# Patient Record
Sex: Female | Born: 1935 | Race: White | Hispanic: No | Marital: Married | Smoking: Never smoker
Health system: Southern US, Community
[De-identification: ages and names within clinical notes are randomized; demographics above are authoritative.]

## PROBLEM LIST (undated history)

## (undated) DIAGNOSIS — H9190 Unspecified hearing loss, unspecified ear: Secondary | ICD-10-CM

## (undated) DIAGNOSIS — T4145XA Adverse effect of unspecified anesthetic, initial encounter: Secondary | ICD-10-CM

## (undated) DIAGNOSIS — R609 Edema, unspecified: Secondary | ICD-10-CM

## (undated) DIAGNOSIS — M1990 Unspecified osteoarthritis, unspecified site: Secondary | ICD-10-CM

## (undated) DIAGNOSIS — T8859XA Other complications of anesthesia, initial encounter: Secondary | ICD-10-CM

## (undated) DIAGNOSIS — K449 Diaphragmatic hernia without obstruction or gangrene: Secondary | ICD-10-CM

## (undated) DIAGNOSIS — R600 Localized edema: Secondary | ICD-10-CM

## (undated) DIAGNOSIS — I499 Cardiac arrhythmia, unspecified: Secondary | ICD-10-CM

## (undated) DIAGNOSIS — Z8719 Personal history of other diseases of the digestive system: Secondary | ICD-10-CM

## (undated) DIAGNOSIS — K219 Gastro-esophageal reflux disease without esophagitis: Secondary | ICD-10-CM

## (undated) DIAGNOSIS — R319 Hematuria, unspecified: Secondary | ICD-10-CM

## (undated) HISTORY — DX: Localized edema: R60.0

## (undated) HISTORY — PX: RECTAL POLYPECTOMY: SHX2309

## (undated) HISTORY — DX: Personal history of other diseases of the digestive system: Z87.19

## (undated) HISTORY — PX: HAMMER TOE SURGERY: SHX385

## (undated) HISTORY — PX: TOTAL ABDOMINAL HYSTERECTOMY: SHX209

## (undated) HISTORY — DX: Edema, unspecified: R60.9

## (undated) HISTORY — PX: ROTATOR CUFF REPAIR: SHX139

## (undated) HISTORY — PX: ESOPHAGOGASTRODUODENOSCOPY ENDOSCOPY: SHX5814

## (undated) HISTORY — DX: Diaphragmatic hernia without obstruction or gangrene: K44.9

## (undated) HISTORY — DX: Gastro-esophageal reflux disease without esophagitis: K21.9

## (undated) HISTORY — PX: OTHER SURGICAL HISTORY: SHX169

## (undated) HISTORY — PX: KNEE ARTHROSCOPY: SUR90

## (undated) HISTORY — DX: Hematuria, unspecified: R31.9

## (undated) HISTORY — PX: CYSTECTOMY: SUR359

---

## 2000-07-05 ENCOUNTER — Other Ambulatory Visit: Admission: RE | Admit: 2000-07-05 | Discharge: 2000-07-05 | Payer: Self-pay | Admitting: *Deleted

## 2000-07-25 ENCOUNTER — Encounter: Admission: RE | Admit: 2000-07-25 | Discharge: 2000-07-25 | Payer: Self-pay | Admitting: Obstetrics and Gynecology

## 2000-07-25 ENCOUNTER — Encounter: Payer: Self-pay | Admitting: Obstetrics and Gynecology

## 2000-08-16 ENCOUNTER — Ambulatory Visit (HOSPITAL_COMMUNITY): Admission: RE | Admit: 2000-08-16 | Discharge: 2000-08-16 | Payer: Self-pay | Admitting: Obstetrics and Gynecology

## 2000-08-16 ENCOUNTER — Encounter: Payer: Self-pay | Admitting: Obstetrics and Gynecology

## 2000-08-24 ENCOUNTER — Encounter (INDEPENDENT_AMBULATORY_CARE_PROVIDER_SITE_OTHER): Payer: Self-pay | Admitting: Specialist

## 2000-08-24 ENCOUNTER — Inpatient Hospital Stay (HOSPITAL_COMMUNITY): Admission: RE | Admit: 2000-08-24 | Discharge: 2000-08-26 | Payer: Self-pay | Admitting: Obstetrics and Gynecology

## 2001-05-19 ENCOUNTER — Encounter: Payer: Self-pay | Admitting: Otolaryngology

## 2001-05-19 ENCOUNTER — Ambulatory Visit (HOSPITAL_COMMUNITY): Admission: RE | Admit: 2001-05-19 | Discharge: 2001-05-19 | Payer: Self-pay | Admitting: Otolaryngology

## 2001-11-13 ENCOUNTER — Encounter: Payer: Self-pay | Admitting: Obstetrics and Gynecology

## 2001-11-13 ENCOUNTER — Encounter: Admission: RE | Admit: 2001-11-13 | Discharge: 2001-11-13 | Payer: Self-pay | Admitting: Obstetrics and Gynecology

## 2003-05-07 ENCOUNTER — Encounter: Payer: Self-pay | Admitting: Obstetrics and Gynecology

## 2003-05-07 ENCOUNTER — Encounter: Admission: RE | Admit: 2003-05-07 | Discharge: 2003-05-07 | Payer: Self-pay | Admitting: Obstetrics and Gynecology

## 2003-05-10 ENCOUNTER — Encounter: Admission: RE | Admit: 2003-05-10 | Discharge: 2003-05-10 | Payer: Self-pay | Admitting: Obstetrics and Gynecology

## 2003-05-10 ENCOUNTER — Encounter: Payer: Self-pay | Admitting: Obstetrics and Gynecology

## 2004-09-02 ENCOUNTER — Ambulatory Visit (HOSPITAL_COMMUNITY): Admission: RE | Admit: 2004-09-02 | Discharge: 2004-09-02 | Payer: Self-pay | Admitting: Gastroenterology

## 2004-12-10 ENCOUNTER — Ambulatory Visit (HOSPITAL_BASED_OUTPATIENT_CLINIC_OR_DEPARTMENT_OTHER): Admission: RE | Admit: 2004-12-10 | Discharge: 2004-12-10 | Payer: Self-pay | Admitting: Orthopedic Surgery

## 2004-12-10 ENCOUNTER — Ambulatory Visit (HOSPITAL_COMMUNITY): Admission: RE | Admit: 2004-12-10 | Discharge: 2004-12-10 | Payer: Self-pay | Admitting: Orthopedic Surgery

## 2004-12-12 ENCOUNTER — Emergency Department (HOSPITAL_COMMUNITY): Admission: EM | Admit: 2004-12-12 | Discharge: 2004-12-12 | Payer: Self-pay | Admitting: Emergency Medicine

## 2005-12-06 ENCOUNTER — Encounter: Admission: RE | Admit: 2005-12-06 | Discharge: 2005-12-06 | Payer: Self-pay | Admitting: Obstetrics and Gynecology

## 2007-10-11 ENCOUNTER — Encounter: Admission: RE | Admit: 2007-10-11 | Discharge: 2007-10-11 | Payer: Self-pay | Admitting: Obstetrics and Gynecology

## 2007-11-06 ENCOUNTER — Encounter: Admission: RE | Admit: 2007-11-06 | Discharge: 2007-11-06 | Payer: Self-pay | Admitting: Obstetrics and Gynecology

## 2008-01-03 ENCOUNTER — Encounter: Admission: RE | Admit: 2008-01-03 | Discharge: 2008-01-03 | Payer: Self-pay | Admitting: Orthopedic Surgery

## 2008-12-30 ENCOUNTER — Encounter: Admission: RE | Admit: 2008-12-30 | Discharge: 2008-12-30 | Payer: Self-pay | Admitting: Family Medicine

## 2009-07-18 ENCOUNTER — Encounter: Admission: RE | Admit: 2009-07-18 | Discharge: 2009-07-18 | Payer: Self-pay | Admitting: Family Medicine

## 2010-11-15 ENCOUNTER — Encounter: Payer: Self-pay | Admitting: Family Medicine

## 2010-11-15 ENCOUNTER — Encounter: Payer: Self-pay | Admitting: Obstetrics and Gynecology

## 2011-02-15 ENCOUNTER — Other Ambulatory Visit: Payer: Self-pay | Admitting: Family Medicine

## 2011-02-15 DIAGNOSIS — Z1231 Encounter for screening mammogram for malignant neoplasm of breast: Secondary | ICD-10-CM

## 2011-02-25 ENCOUNTER — Ambulatory Visit
Admission: RE | Admit: 2011-02-25 | Discharge: 2011-02-25 | Disposition: A | Discharge status: Home / Self Care | Payer: Medicare Other | Source: Ambulatory Visit | Attending: Family Medicine | Admitting: Family Medicine

## 2011-02-25 DIAGNOSIS — Z1231 Encounter for screening mammogram for malignant neoplasm of breast: Secondary | ICD-10-CM

## 2011-02-26 ENCOUNTER — Other Ambulatory Visit: Payer: Self-pay | Admitting: Family Medicine

## 2011-02-26 DIAGNOSIS — R928 Other abnormal and inconclusive findings on diagnostic imaging of breast: Secondary | ICD-10-CM

## 2011-03-03 ENCOUNTER — Ambulatory Visit
Admission: RE | Admit: 2011-03-03 | Discharge: 2011-03-03 | Disposition: A | Discharge status: Home / Self Care | Payer: Medicare Other | Source: Ambulatory Visit | Attending: Family Medicine | Admitting: Family Medicine

## 2011-03-03 DIAGNOSIS — R928 Other abnormal and inconclusive findings on diagnostic imaging of breast: Secondary | ICD-10-CM

## 2011-03-12 NOTE — Discharge Summary (Signed)
[facility]  Patient:    Brenda Hunter, Brenda Hunter                   MRN: [id number] Adm. Date:  [id number] Disc. Date: [id number] Attending:  Lendon Colonel                           Discharge Summary  ADMITTING DIAGNOSES:  Genital prolapse with pelvic pressure and discomfort.  DISCHARGE DIAGNOSES:  Genital prolapse with pelvic pressure and discomfort.  OPERATION PERFORMED:  Laparoscopically assisted vaginal hysterectomy, bilateral salpingo-oophorectomy, anterior and posterior repair.  BRIEF HISTORY:  This patient is a 76 year old female who is using ______ suppositories for the improvement of vaginal health who presents for vaginal hysterectomy for complete prolapse. She has complained of pelvic pressure and things that are falling out when she stands. In fact, she had complete prolapse. She had been evaluated by Dr. Logan Bores most recently for persistent hematuria and found to be in good health. She denied stress incontinence.  LABORATORY DATA:  Admission hemoglobin was 14, white count 7,000, coagulation profile was normal, cardiogram was normal.  HOSPITAL COURSE:  The patient was admitted to the hospital and underwent an uneventful laparoscopically assisted vaginal hysterectomy, bilateral salpingo-oophorectomy, posterior repair. Her postoperative course was uncomplicated. She was discharged on November 2 to continue Cipro 1 daily, Pyridium, Surfak and Percocet for discomfort.  CONDITION ON DISCHARGE:  Improved. DD:  09/14/00 TD:  09/16/00 Job: [id number]

## 2011-03-12 NOTE — Op Note (Signed)
[facility]  Patient:    Brenda Hunter, Brenda Hunter                   MRN: [id number] Proc. Date: 08/24/00 Adm. Date:  [id number] Attending:  Lendon Colonel                           Operative Report  PREOPERATIVE DIAGNOSIS:  Complete genital prolapse with cystocele, rectocele, and uterine descensus.  POSTOPERATIVE DIAGNOSIS:  Complete genital prolapse with cystocele, rectocele, and uterine descensus.  PROCEDURE:  Laparoscopically assisted vaginal hysterectomy, bilateral salpingo-oophorectomy, anterior and posterior colporrhaphy.  DESCRIPTION OF PROCEDURE:  The patient was placed in the lithotomy position and prepped and draped in the usual fashion.  There was a marked amount of descensus.  The Hulka elevator was inserted into the cervix.  A transverse incision was made in the umbilicus.  The abdomen was distended with carbon dioxide.  This was under low pressure and distended to about 2.5 L.  Trocar was then inserted into the abdomen, and visualization of the pelvis revealed a very small right ovary.  The left ovary was stuck under the colon from adhesions.  Three other trocar sites were utilized, 5 mm in both lower quadrants and in the midline.  The grasper forceps were used to tent up the right infundibulopelvic ligament and the right round ligament, and these were severed with the Seitzinger tripolar forceps.  Attention was then drawn to the patients left side, and the peritoneum was entered anteriorly to dissect the infundibulopelvic ligament from the colon.  The congenital adhesions were removed from the colon, and the colon was pushed downward to reveal thickened infundibulopelvic ligament, which was carefully skeletonized, and then the infundibulopelvic ligament was severed using the Seitzinger tripolar forceps. Round ligament on that side was severed, and the uterine vessels were severed using the Seitzinger 5 mm tripolar forceps.  In a similar  fashion was handled on the right side, bladder flap was created, and we went down below to complete the vaginal hysterectomy by incising and anteriorly and posteriorly clamping the uterosacral ligaments and the cardinal ligaments, and then the uterus was removed from the operative field.  Following this, the uterosacral ligaments were plicated in the midline, first with 0 Vicryl and then with an 0 Ethibond permanent suture in front of behind that.  This afforded good vault support.  The posterior vagina was then closed with interrupted sutures of 0 Vicryl and then the anterior peritoneum was closed with 2-0 PDS.  Anterior repair was then effected by incising in the vagina and plicating the pubocervical vaginal fascia with interrupted sutures of 3-0 Vicryl.  A wedge of the vagina anteriorly was removed and then closed with the continuation of the peritoneal suture.  This obliterated the dead space.  We then went down below and did the posterior repair by wedging out the perineal body and in dissecting the vagina from the underlying rectum, the lateral prerectal fascia was then plicated in the midline with interrupted sutures of 3-0 Vicryl.  This effected a good posterior repair.  The vagina was then closed with a running suture of 2-0 chromic, perineal skin was closed with this continuous suture. Hemostasis was secure.  The vagina was packed.  We then went above and inspected the trocar sites.  These were hemostatically secure.  The pedicles were secure.  Copious amounts of irrigation were used to irrigate the pedicles.  Then we closed the  umbilical incision, which was a 10 mm trocar, with one suture of 0 Vicryl, followed by the skin incisions of all four ports were closed with a subcuticular 3-0 Vicryl.  The incisions were then infiltrated with 0.5% Marcaine with epinephrine.  Brenda Hunter tolerated this procedure well and was sent to the recovery room in good condition. DD:  08/24/00 TD:   08/24/00 Job: [id number]

## 2011-03-12 NOTE — Op Note (Signed)
Brenda Hunter, Brenda Hunter                ACCOUNT NO.:  [id number]   MEDICAL RECORD NO.:  [id number]          PATIENT TYPE:  AMB   LOCATION:  DSC                          FACILITY:  [facility]   PHYSICIAN:  Rodney A. Mortenson, M.D.DATE OF BIRTH:  [birth year]   DATE OF PROCEDURE:  12/10/2004  DATE OF DISCHARGE:                                 OPERATIVE REPORT   PREOPERATIVE DIAGNOSIS:  Tear of rotator cuff of right shoulder.   POSTOPERATIVE DIAGNOSES:  1.  Full-thickness tear of rotator cuff of right shoulder.  2.  Tear of anterosuperior labrum of right shoulder.   OPERATIONS:  1.  Arthroscopy.  2.  Debridement of anterosuperior labrum through the arthroscope.  3.  Open acromioplasty and open repair of rotator cuff tear of right      shoulder.   SURGEON:  Lenard Galloway. Chaney Malling, M.D.   ANESTHESIA:  General.   DESCRIPTION OF PROCEDURE:  The patient was placed on the operating table in  the supine position.  After satisfactory general anesthesia, the patient was  placed in a semiseated position.  The right upper extremity and shoulder  were prepped with Duraprep and draped out in the usual manner.  Through the  standard posterior portal, the arthroscope was introduced and through an  anterior operative portal, a full radial resector was inserted.  Articular  cartilage of the humeral head and the glenoid appeared absolutely normal.  The inferior part of the labrum was normal, but the superior part was frayed  and torn.  The biceps anchor was intact.  The rest of the biceps tendon as  it exited the shoulder appeared fairly normal.  There was some fraying of  the rotator cuff superiorly.  The arthroscope could be passed through the  hole in the rotator cuff into the subacromial space.  The intra-articular  shaver was then used to debride the anterosuperior labrum.  After this was  done, this was palpated with a probe and was quite stable.  The arthroscope  was then removed and placed in the  subacromial space.  Visualization did  show the tear in the rotator cuff and the arthroscope was removed.   A saber cut incision was made over the anterolateral aspect of the right  shoulder.  Skin edges were retracted.  Bleeders were coagulated.  The self-  retaining retractor was put in place.  Deltoid fibers were released off of  the anterior aspect of the acromion only.  An acromioplasty was done.  This  gave excellent access to the shoulder.  The bursa was excised.  The tendon  of the rotator cuff was clearly seen.  The was sutured side to side with 0  Ethibond sutures.  There was an area of the rotator cuff that was markedly  thin and this was plicated with Ethibond sutures.  Throughout the procedure,  the shoulder was irrigated with copious amounts of antibiotic solution.  The  deltoid fibers were then reattached to the anterior aspect of the acromion  using 0 Vicryl sutures.  Then 2-0 Vicryl was used to close the subcutaneous  tissue and  stainless steel staples were used to close the wound.  Sterile  dressings were applied.  The patient returned to the recovery room in  excellent condition.  The procedure went extremely well.   FOLLOWUP CARE:  1.  To my office on Wednesday.  2.  Sling, right arm.  3.  Percocet for pain.      RAM/MEDQ  D:  12/10/2004  T:  12/10/2004  Job:  [id number]

## 2011-03-12 NOTE — Op Note (Signed)
Brenda Hunter, Brenda Hunter                ACCOUNT NO.:  [id number]   MEDICAL RECORD NO.:  [id number]          PATIENT TYPE:  AMB   LOCATION:  ENDO                         FACILITY:  [facility]   PHYSICIAN:  James L. Malon Kindle., M.D.DATE OF BIRTH:  [birth year]   DATE OF PROCEDURE:  09/02/2004  DATE OF DISCHARGE:                                 OPERATIVE REPORT   PROCEDURE:  Colonoscopy.   MEDICATIONS:  Fentanyl 125 mcg, Versed 10 mg IV   SCOPE:  Olympus pediatric adjustable colonoscopy.   INDICATIONS:  History of colon polyps for 5 year followup.   DESCRIPTION OF PROCEDURE:  Procedure had been explained to the patient and  consent obtained. With the patient in left lateral decubitus position,  Olympus scope was inserted and advanced. The patient did have some  diverticular disease. We were able to pass the sigmoid colon. Had a long  tortuous colon. Using abdominal pressure and position changes, were able to  advance over to the cecum. The ileocecal valve and appendiceal orifice were  seen. The scope was withdrawn to the cecum. Ascending colon, transverse  colon, descending, and sigmoid colon were seen well. There were no polyps  seen. Scope was withdrawn into the rectum, and the rectum was free of  polyps. The scope was withdrawn, and the patient tolerated the procedure  well.   ASSESSMENT:  History of colon polyps with negative colonoscopy, V12.72.   PLAN:  Will recommend yearly hemoccults and repeat colonoscopy in 5 years.      JLE/MEDQ  D:  09/02/2004  T:  09/02/2004  Job:  [id number]

## 2011-03-12 NOTE — H&P (Signed)
[facility]  Patient:    Brenda Hunter, Brenda Hunter                         MRN: [id number] Adm. Date:  08/24/00 Attending:  Katherine Roan, M.D.                         History and Physical  CHIEF COMPLAINT:  This is a 76 year old, gravida 2, para 2, postmenopausal female who is using Vagifem suppositories vaginally for improvement of vaginal health, who presents for a vaginal hysterectomy, A&P repair for a complete prolapse.  She complains of pelvic pressure and the uterus falling outside when she stands.  She had had some hematuria and evaluated three years ago by Dr. Logan Bores, and most recently was evaluated and found to be completely normal. Because of the pelvic relaxation, hysterectomy was recommended.  She has no incontinence at this time.  PAST SURGICAL HISTORY:  Breast biopsies in 1974.  Considers herself to be in excellent health.  ALLERGIES:  She has no known allergies.  MEDICATIONS:  Currently takes no medications.  REVIEW OF SYSTEMS:  HEENT:  She wears glasses but no headaches or decrease in visual or auditory acuity, or dizziness.  HEART:  No hypertension.  No rheumatic fever.  No mitral valve prolapse.  No chest pain or shortness of breath.  LUNGS:  Negative.  No chronic cough.  No asthma.  No history of hemoptysis.  GENITOURINARY:  She has occasional stress urinary tract infection but denies any incontinence.  She has minimum bladder irritability. GASTROINTESTINAL:  No bowel habit change.  No weight loss.  No anorexia.  No melena.  Muscle, bones and joints:  No fractures or arthritis.  No decrease in range of motion.  SOCIAL HISTORY:  She is a Futures trader.  FAMILY HISTORY:  Her mother is 13, living and well.  Her father is 28, living and well.  She has two brothers and five sisters who are in good health. Her father has had a pacemaker.  PHYSICAL EXAMINATION:  VITAL SIGNS:  Examination reveals a weight of 143, a blood pressure of  130/80.  HEENT:  Examination of the eyes, ears, nose, and throat are unremarkable.  Lysbeth Penner is not injected.  NECK:  Supple.  Thyroid is not enlarged.  Trachea is in the midline.  Carotid pulses are without bruits and are equal.  No adenopathy appreciated.  BREASTS:  No masses or tenderness.  Axilla free from adenopathy.  LUNGS:  Clear.  HEART:  Normal sinus rhythm.  No murmurs.  ABDOMEN:  Soft, flat.  Liver, spleen and kidneys are not palpated.  Bowel sounds are normal.  No bruits are heard.  No hernia.  EXTREMITIES:  Show good range of motion.  Equal pulses and reflexes.  PELVIC:  Examination reveals a complete prolapse of the cervix.  There is minimal cystocele, although the base of the bladder appears to come down with the uterus.  There is a moderate rectocele present.  No pelvic masses are noted.  Hemoccult is negative.  Colonoscopy about three years ago was said to be negative.  IMPRESSION:  Complete prolapse.  PLAN:  Laparoscopic-assisted hysterectomy, remove both ovaries and a posterior repair and possibly anterior repair.  Detailed informed consent has been given to the patient including vascular injury, bladder or bowel injury, infection and hemorrhage. DD:  08/24/00 TD:  08/24/00 Job: [id number]

## 2011-10-27 DIAGNOSIS — IMO0002 Reserved for concepts with insufficient information to code with codable children: Secondary | ICD-10-CM | POA: Diagnosis not present

## 2011-10-27 DIAGNOSIS — M461 Sacroiliitis, not elsewhere classified: Secondary | ICD-10-CM | POA: Diagnosis not present

## 2011-10-27 DIAGNOSIS — M999 Biomechanical lesion, unspecified: Secondary | ICD-10-CM | POA: Diagnosis not present

## 2011-10-28 DIAGNOSIS — M461 Sacroiliitis, not elsewhere classified: Secondary | ICD-10-CM | POA: Diagnosis not present

## 2011-10-28 DIAGNOSIS — IMO0002 Reserved for concepts with insufficient information to code with codable children: Secondary | ICD-10-CM | POA: Diagnosis not present

## 2011-10-28 DIAGNOSIS — M999 Biomechanical lesion, unspecified: Secondary | ICD-10-CM | POA: Diagnosis not present

## 2011-11-03 DIAGNOSIS — M461 Sacroiliitis, not elsewhere classified: Secondary | ICD-10-CM | POA: Diagnosis not present

## 2011-11-03 DIAGNOSIS — IMO0002 Reserved for concepts with insufficient information to code with codable children: Secondary | ICD-10-CM | POA: Diagnosis not present

## 2011-11-03 DIAGNOSIS — M999 Biomechanical lesion, unspecified: Secondary | ICD-10-CM | POA: Diagnosis not present

## 2011-11-04 DIAGNOSIS — R3129 Other microscopic hematuria: Secondary | ICD-10-CM | POA: Diagnosis not present

## 2011-11-04 DIAGNOSIS — N390 Urinary tract infection, site not specified: Secondary | ICD-10-CM | POA: Insufficient documentation

## 2011-11-04 DIAGNOSIS — N3020 Other chronic cystitis without hematuria: Secondary | ICD-10-CM | POA: Diagnosis not present

## 2011-11-04 DIAGNOSIS — N3000 Acute cystitis without hematuria: Secondary | ICD-10-CM | POA: Diagnosis not present

## 2011-11-05 DIAGNOSIS — Z23 Encounter for immunization: Secondary | ICD-10-CM | POA: Diagnosis not present

## 2011-11-08 DIAGNOSIS — IMO0002 Reserved for concepts with insufficient information to code with codable children: Secondary | ICD-10-CM | POA: Diagnosis not present

## 2011-11-08 DIAGNOSIS — M999 Biomechanical lesion, unspecified: Secondary | ICD-10-CM | POA: Diagnosis not present

## 2011-11-08 DIAGNOSIS — M461 Sacroiliitis, not elsewhere classified: Secondary | ICD-10-CM | POA: Diagnosis not present

## 2011-11-11 DIAGNOSIS — M999 Biomechanical lesion, unspecified: Secondary | ICD-10-CM | POA: Diagnosis not present

## 2011-11-11 DIAGNOSIS — IMO0002 Reserved for concepts with insufficient information to code with codable children: Secondary | ICD-10-CM | POA: Diagnosis not present

## 2011-11-11 DIAGNOSIS — M461 Sacroiliitis, not elsewhere classified: Secondary | ICD-10-CM | POA: Diagnosis not present

## 2011-11-15 DIAGNOSIS — M999 Biomechanical lesion, unspecified: Secondary | ICD-10-CM | POA: Diagnosis not present

## 2011-11-15 DIAGNOSIS — IMO0002 Reserved for concepts with insufficient information to code with codable children: Secondary | ICD-10-CM | POA: Diagnosis not present

## 2011-11-15 DIAGNOSIS — M461 Sacroiliitis, not elsewhere classified: Secondary | ICD-10-CM | POA: Diagnosis not present

## 2011-11-18 DIAGNOSIS — IMO0002 Reserved for concepts with insufficient information to code with codable children: Secondary | ICD-10-CM | POA: Diagnosis not present

## 2011-11-18 DIAGNOSIS — M999 Biomechanical lesion, unspecified: Secondary | ICD-10-CM | POA: Diagnosis not present

## 2011-11-18 DIAGNOSIS — M461 Sacroiliitis, not elsewhere classified: Secondary | ICD-10-CM | POA: Diagnosis not present

## 2011-11-19 DIAGNOSIS — J019 Acute sinusitis, unspecified: Secondary | ICD-10-CM | POA: Diagnosis not present

## 2011-11-22 DIAGNOSIS — IMO0002 Reserved for concepts with insufficient information to code with codable children: Secondary | ICD-10-CM | POA: Diagnosis not present

## 2011-11-22 DIAGNOSIS — M999 Biomechanical lesion, unspecified: Secondary | ICD-10-CM | POA: Diagnosis not present

## 2011-11-22 DIAGNOSIS — M461 Sacroiliitis, not elsewhere classified: Secondary | ICD-10-CM | POA: Diagnosis not present

## 2011-11-29 DIAGNOSIS — H43399 Other vitreous opacities, unspecified eye: Secondary | ICD-10-CM | POA: Diagnosis not present

## 2011-11-29 DIAGNOSIS — H40019 Open angle with borderline findings, low risk, unspecified eye: Secondary | ICD-10-CM | POA: Diagnosis not present

## 2011-11-29 DIAGNOSIS — H2510 Age-related nuclear cataract, unspecified eye: Secondary | ICD-10-CM | POA: Diagnosis not present

## 2011-11-29 DIAGNOSIS — H04129 Dry eye syndrome of unspecified lacrimal gland: Secondary | ICD-10-CM | POA: Diagnosis not present

## 2011-11-30 DIAGNOSIS — M461 Sacroiliitis, not elsewhere classified: Secondary | ICD-10-CM | POA: Diagnosis not present

## 2011-11-30 DIAGNOSIS — IMO0002 Reserved for concepts with insufficient information to code with codable children: Secondary | ICD-10-CM | POA: Diagnosis not present

## 2011-11-30 DIAGNOSIS — Q825 Congenital non-neoplastic nevus: Secondary | ICD-10-CM | POA: Diagnosis not present

## 2011-11-30 DIAGNOSIS — M999 Biomechanical lesion, unspecified: Secondary | ICD-10-CM | POA: Diagnosis not present

## 2011-12-02 DIAGNOSIS — IMO0002 Reserved for concepts with insufficient information to code with codable children: Secondary | ICD-10-CM | POA: Diagnosis not present

## 2011-12-02 DIAGNOSIS — M999 Biomechanical lesion, unspecified: Secondary | ICD-10-CM | POA: Diagnosis not present

## 2011-12-02 DIAGNOSIS — M461 Sacroiliitis, not elsewhere classified: Secondary | ICD-10-CM | POA: Diagnosis not present

## 2011-12-06 DIAGNOSIS — M999 Biomechanical lesion, unspecified: Secondary | ICD-10-CM | POA: Diagnosis not present

## 2011-12-06 DIAGNOSIS — IMO0002 Reserved for concepts with insufficient information to code with codable children: Secondary | ICD-10-CM | POA: Diagnosis not present

## 2011-12-06 DIAGNOSIS — M461 Sacroiliitis, not elsewhere classified: Secondary | ICD-10-CM | POA: Diagnosis not present

## 2011-12-09 DIAGNOSIS — M999 Biomechanical lesion, unspecified: Secondary | ICD-10-CM | POA: Diagnosis not present

## 2011-12-09 DIAGNOSIS — M461 Sacroiliitis, not elsewhere classified: Secondary | ICD-10-CM | POA: Diagnosis not present

## 2011-12-09 DIAGNOSIS — IMO0002 Reserved for concepts with insufficient information to code with codable children: Secondary | ICD-10-CM | POA: Diagnosis not present

## 2011-12-23 DIAGNOSIS — M461 Sacroiliitis, not elsewhere classified: Secondary | ICD-10-CM | POA: Diagnosis not present

## 2011-12-23 DIAGNOSIS — IMO0002 Reserved for concepts with insufficient information to code with codable children: Secondary | ICD-10-CM | POA: Diagnosis not present

## 2011-12-23 DIAGNOSIS — M999 Biomechanical lesion, unspecified: Secondary | ICD-10-CM | POA: Diagnosis not present

## 2011-12-29 DIAGNOSIS — IMO0002 Reserved for concepts with insufficient information to code with codable children: Secondary | ICD-10-CM | POA: Diagnosis not present

## 2011-12-29 DIAGNOSIS — M999 Biomechanical lesion, unspecified: Secondary | ICD-10-CM | POA: Diagnosis not present

## 2011-12-29 DIAGNOSIS — M461 Sacroiliitis, not elsewhere classified: Secondary | ICD-10-CM | POA: Diagnosis not present

## 2012-01-03 DIAGNOSIS — IMO0002 Reserved for concepts with insufficient information to code with codable children: Secondary | ICD-10-CM | POA: Diagnosis not present

## 2012-01-03 DIAGNOSIS — M461 Sacroiliitis, not elsewhere classified: Secondary | ICD-10-CM | POA: Diagnosis not present

## 2012-01-03 DIAGNOSIS — M999 Biomechanical lesion, unspecified: Secondary | ICD-10-CM | POA: Diagnosis not present

## 2012-01-11 DIAGNOSIS — M461 Sacroiliitis, not elsewhere classified: Secondary | ICD-10-CM | POA: Diagnosis not present

## 2012-01-11 DIAGNOSIS — M999 Biomechanical lesion, unspecified: Secondary | ICD-10-CM | POA: Diagnosis not present

## 2012-01-11 DIAGNOSIS — IMO0002 Reserved for concepts with insufficient information to code with codable children: Secondary | ICD-10-CM | POA: Diagnosis not present

## 2012-01-13 DIAGNOSIS — M461 Sacroiliitis, not elsewhere classified: Secondary | ICD-10-CM | POA: Diagnosis not present

## 2012-01-13 DIAGNOSIS — M999 Biomechanical lesion, unspecified: Secondary | ICD-10-CM | POA: Diagnosis not present

## 2012-01-13 DIAGNOSIS — IMO0002 Reserved for concepts with insufficient information to code with codable children: Secondary | ICD-10-CM | POA: Diagnosis not present

## 2012-01-18 DIAGNOSIS — IMO0002 Reserved for concepts with insufficient information to code with codable children: Secondary | ICD-10-CM | POA: Diagnosis not present

## 2012-01-18 DIAGNOSIS — M999 Biomechanical lesion, unspecified: Secondary | ICD-10-CM | POA: Diagnosis not present

## 2012-01-18 DIAGNOSIS — M461 Sacroiliitis, not elsewhere classified: Secondary | ICD-10-CM | POA: Diagnosis not present

## 2012-01-25 DIAGNOSIS — IMO0002 Reserved for concepts with insufficient information to code with codable children: Secondary | ICD-10-CM | POA: Diagnosis not present

## 2012-01-25 DIAGNOSIS — M999 Biomechanical lesion, unspecified: Secondary | ICD-10-CM | POA: Diagnosis not present

## 2012-01-25 DIAGNOSIS — M461 Sacroiliitis, not elsewhere classified: Secondary | ICD-10-CM | POA: Diagnosis not present

## 2012-02-03 DIAGNOSIS — IMO0002 Reserved for concepts with insufficient information to code with codable children: Secondary | ICD-10-CM | POA: Diagnosis not present

## 2012-02-03 DIAGNOSIS — M999 Biomechanical lesion, unspecified: Secondary | ICD-10-CM | POA: Diagnosis not present

## 2012-02-03 DIAGNOSIS — M461 Sacroiliitis, not elsewhere classified: Secondary | ICD-10-CM | POA: Diagnosis not present

## 2012-02-10 DIAGNOSIS — M461 Sacroiliitis, not elsewhere classified: Secondary | ICD-10-CM | POA: Diagnosis not present

## 2012-02-10 DIAGNOSIS — IMO0002 Reserved for concepts with insufficient information to code with codable children: Secondary | ICD-10-CM | POA: Diagnosis not present

## 2012-02-10 DIAGNOSIS — M999 Biomechanical lesion, unspecified: Secondary | ICD-10-CM | POA: Diagnosis not present

## 2012-02-15 DIAGNOSIS — M999 Biomechanical lesion, unspecified: Secondary | ICD-10-CM | POA: Diagnosis not present

## 2012-02-15 DIAGNOSIS — IMO0002 Reserved for concepts with insufficient information to code with codable children: Secondary | ICD-10-CM | POA: Diagnosis not present

## 2012-02-15 DIAGNOSIS — M461 Sacroiliitis, not elsewhere classified: Secondary | ICD-10-CM | POA: Diagnosis not present

## 2012-02-22 DIAGNOSIS — M461 Sacroiliitis, not elsewhere classified: Secondary | ICD-10-CM | POA: Diagnosis not present

## 2012-02-22 DIAGNOSIS — M999 Biomechanical lesion, unspecified: Secondary | ICD-10-CM | POA: Diagnosis not present

## 2012-02-22 DIAGNOSIS — IMO0002 Reserved for concepts with insufficient information to code with codable children: Secondary | ICD-10-CM | POA: Diagnosis not present

## 2012-03-14 DIAGNOSIS — M461 Sacroiliitis, not elsewhere classified: Secondary | ICD-10-CM | POA: Diagnosis not present

## 2012-03-14 DIAGNOSIS — IMO0002 Reserved for concepts with insufficient information to code with codable children: Secondary | ICD-10-CM | POA: Diagnosis not present

## 2012-03-14 DIAGNOSIS — M999 Biomechanical lesion, unspecified: Secondary | ICD-10-CM | POA: Diagnosis not present

## 2012-03-21 DIAGNOSIS — M461 Sacroiliitis, not elsewhere classified: Secondary | ICD-10-CM | POA: Diagnosis not present

## 2012-03-21 DIAGNOSIS — IMO0002 Reserved for concepts with insufficient information to code with codable children: Secondary | ICD-10-CM | POA: Diagnosis not present

## 2012-03-21 DIAGNOSIS — M999 Biomechanical lesion, unspecified: Secondary | ICD-10-CM | POA: Diagnosis not present

## 2012-03-23 DIAGNOSIS — IMO0002 Reserved for concepts with insufficient information to code with codable children: Secondary | ICD-10-CM | POA: Diagnosis not present

## 2012-03-23 DIAGNOSIS — M999 Biomechanical lesion, unspecified: Secondary | ICD-10-CM | POA: Diagnosis not present

## 2012-03-23 DIAGNOSIS — M461 Sacroiliitis, not elsewhere classified: Secondary | ICD-10-CM | POA: Diagnosis not present

## 2012-04-04 DIAGNOSIS — M999 Biomechanical lesion, unspecified: Secondary | ICD-10-CM | POA: Diagnosis not present

## 2012-04-04 DIAGNOSIS — M461 Sacroiliitis, not elsewhere classified: Secondary | ICD-10-CM | POA: Diagnosis not present

## 2012-04-04 DIAGNOSIS — IMO0002 Reserved for concepts with insufficient information to code with codable children: Secondary | ICD-10-CM | POA: Diagnosis not present

## 2012-04-12 DIAGNOSIS — M999 Biomechanical lesion, unspecified: Secondary | ICD-10-CM | POA: Diagnosis not present

## 2012-04-12 DIAGNOSIS — IMO0002 Reserved for concepts with insufficient information to code with codable children: Secondary | ICD-10-CM | POA: Diagnosis not present

## 2012-04-12 DIAGNOSIS — M461 Sacroiliitis, not elsewhere classified: Secondary | ICD-10-CM | POA: Diagnosis not present

## 2012-04-19 DIAGNOSIS — M999 Biomechanical lesion, unspecified: Secondary | ICD-10-CM | POA: Diagnosis not present

## 2012-04-19 DIAGNOSIS — IMO0002 Reserved for concepts with insufficient information to code with codable children: Secondary | ICD-10-CM | POA: Diagnosis not present

## 2012-04-19 DIAGNOSIS — M461 Sacroiliitis, not elsewhere classified: Secondary | ICD-10-CM | POA: Diagnosis not present

## 2012-05-02 DIAGNOSIS — IMO0002 Reserved for concepts with insufficient information to code with codable children: Secondary | ICD-10-CM | POA: Diagnosis not present

## 2012-05-02 DIAGNOSIS — M461 Sacroiliitis, not elsewhere classified: Secondary | ICD-10-CM | POA: Diagnosis not present

## 2012-05-02 DIAGNOSIS — M999 Biomechanical lesion, unspecified: Secondary | ICD-10-CM | POA: Diagnosis not present

## 2012-05-08 DIAGNOSIS — IMO0002 Reserved for concepts with insufficient information to code with codable children: Secondary | ICD-10-CM | POA: Diagnosis not present

## 2012-05-08 DIAGNOSIS — M461 Sacroiliitis, not elsewhere classified: Secondary | ICD-10-CM | POA: Diagnosis not present

## 2012-05-08 DIAGNOSIS — M999 Biomechanical lesion, unspecified: Secondary | ICD-10-CM | POA: Diagnosis not present

## 2012-05-16 DIAGNOSIS — IMO0002 Reserved for concepts with insufficient information to code with codable children: Secondary | ICD-10-CM | POA: Diagnosis not present

## 2012-05-16 DIAGNOSIS — M999 Biomechanical lesion, unspecified: Secondary | ICD-10-CM | POA: Diagnosis not present

## 2012-05-16 DIAGNOSIS — M461 Sacroiliitis, not elsewhere classified: Secondary | ICD-10-CM | POA: Diagnosis not present

## 2012-05-22 DIAGNOSIS — M999 Biomechanical lesion, unspecified: Secondary | ICD-10-CM | POA: Diagnosis not present

## 2012-05-22 DIAGNOSIS — M461 Sacroiliitis, not elsewhere classified: Secondary | ICD-10-CM | POA: Diagnosis not present

## 2012-05-22 DIAGNOSIS — IMO0002 Reserved for concepts with insufficient information to code with codable children: Secondary | ICD-10-CM | POA: Diagnosis not present

## 2012-06-12 DIAGNOSIS — M999 Biomechanical lesion, unspecified: Secondary | ICD-10-CM | POA: Diagnosis not present

## 2012-06-12 DIAGNOSIS — IMO0002 Reserved for concepts with insufficient information to code with codable children: Secondary | ICD-10-CM | POA: Diagnosis not present

## 2012-06-12 DIAGNOSIS — M461 Sacroiliitis, not elsewhere classified: Secondary | ICD-10-CM | POA: Diagnosis not present

## 2012-06-14 DIAGNOSIS — M461 Sacroiliitis, not elsewhere classified: Secondary | ICD-10-CM | POA: Diagnosis not present

## 2012-06-14 DIAGNOSIS — M999 Biomechanical lesion, unspecified: Secondary | ICD-10-CM | POA: Diagnosis not present

## 2012-06-14 DIAGNOSIS — IMO0002 Reserved for concepts with insufficient information to code with codable children: Secondary | ICD-10-CM | POA: Diagnosis not present

## 2012-06-19 DIAGNOSIS — IMO0002 Reserved for concepts with insufficient information to code with codable children: Secondary | ICD-10-CM | POA: Diagnosis not present

## 2012-06-19 DIAGNOSIS — M999 Biomechanical lesion, unspecified: Secondary | ICD-10-CM | POA: Diagnosis not present

## 2012-06-19 DIAGNOSIS — M461 Sacroiliitis, not elsewhere classified: Secondary | ICD-10-CM | POA: Diagnosis not present

## 2012-06-21 DIAGNOSIS — IMO0002 Reserved for concepts with insufficient information to code with codable children: Secondary | ICD-10-CM | POA: Diagnosis not present

## 2012-06-21 DIAGNOSIS — M461 Sacroiliitis, not elsewhere classified: Secondary | ICD-10-CM | POA: Diagnosis not present

## 2012-06-21 DIAGNOSIS — M999 Biomechanical lesion, unspecified: Secondary | ICD-10-CM | POA: Diagnosis not present

## 2012-06-28 DIAGNOSIS — IMO0002 Reserved for concepts with insufficient information to code with codable children: Secondary | ICD-10-CM | POA: Diagnosis not present

## 2012-06-28 DIAGNOSIS — M999 Biomechanical lesion, unspecified: Secondary | ICD-10-CM | POA: Diagnosis not present

## 2012-06-28 DIAGNOSIS — M461 Sacroiliitis, not elsewhere classified: Secondary | ICD-10-CM | POA: Diagnosis not present

## 2012-07-18 DIAGNOSIS — L723 Sebaceous cyst: Secondary | ICD-10-CM | POA: Diagnosis not present

## 2012-07-18 DIAGNOSIS — D485 Neoplasm of uncertain behavior of skin: Secondary | ICD-10-CM | POA: Diagnosis not present

## 2012-07-18 DIAGNOSIS — Z85828 Personal history of other malignant neoplasm of skin: Secondary | ICD-10-CM | POA: Diagnosis not present

## 2012-07-18 DIAGNOSIS — D2330 Other benign neoplasm of skin of unspecified part of face: Secondary | ICD-10-CM | POA: Diagnosis not present

## 2012-07-26 DIAGNOSIS — H04129 Dry eye syndrome of unspecified lacrimal gland: Secondary | ICD-10-CM | POA: Diagnosis not present

## 2012-07-26 DIAGNOSIS — H40019 Open angle with borderline findings, low risk, unspecified eye: Secondary | ICD-10-CM | POA: Diagnosis not present

## 2012-07-26 DIAGNOSIS — H1045 Other chronic allergic conjunctivitis: Secondary | ICD-10-CM | POA: Diagnosis not present

## 2012-08-05 DIAGNOSIS — Z23 Encounter for immunization: Secondary | ICD-10-CM | POA: Diagnosis not present

## 2012-08-07 DIAGNOSIS — IMO0002 Reserved for concepts with insufficient information to code with codable children: Secondary | ICD-10-CM | POA: Diagnosis not present

## 2012-08-07 DIAGNOSIS — M999 Biomechanical lesion, unspecified: Secondary | ICD-10-CM | POA: Diagnosis not present

## 2012-08-07 DIAGNOSIS — M461 Sacroiliitis, not elsewhere classified: Secondary | ICD-10-CM | POA: Diagnosis not present

## 2012-08-14 DIAGNOSIS — M461 Sacroiliitis, not elsewhere classified: Secondary | ICD-10-CM | POA: Diagnosis not present

## 2012-08-14 DIAGNOSIS — M999 Biomechanical lesion, unspecified: Secondary | ICD-10-CM | POA: Diagnosis not present

## 2012-08-14 DIAGNOSIS — IMO0002 Reserved for concepts with insufficient information to code with codable children: Secondary | ICD-10-CM | POA: Diagnosis not present

## 2012-08-22 DIAGNOSIS — L821 Other seborrheic keratosis: Secondary | ICD-10-CM | POA: Diagnosis not present

## 2012-08-23 DIAGNOSIS — M461 Sacroiliitis, not elsewhere classified: Secondary | ICD-10-CM | POA: Diagnosis not present

## 2012-08-23 DIAGNOSIS — M999 Biomechanical lesion, unspecified: Secondary | ICD-10-CM | POA: Diagnosis not present

## 2012-08-23 DIAGNOSIS — IMO0002 Reserved for concepts with insufficient information to code with codable children: Secondary | ICD-10-CM | POA: Diagnosis not present

## 2012-08-30 DIAGNOSIS — M999 Biomechanical lesion, unspecified: Secondary | ICD-10-CM | POA: Diagnosis not present

## 2012-08-30 DIAGNOSIS — IMO0002 Reserved for concepts with insufficient information to code with codable children: Secondary | ICD-10-CM | POA: Diagnosis not present

## 2012-08-30 DIAGNOSIS — M461 Sacroiliitis, not elsewhere classified: Secondary | ICD-10-CM | POA: Diagnosis not present

## 2012-09-18 ENCOUNTER — Other Ambulatory Visit: Payer: Self-pay | Admitting: Family Medicine

## 2012-09-18 DIAGNOSIS — Z1231 Encounter for screening mammogram for malignant neoplasm of breast: Secondary | ICD-10-CM

## 2012-09-19 DIAGNOSIS — M461 Sacroiliitis, not elsewhere classified: Secondary | ICD-10-CM | POA: Diagnosis not present

## 2012-09-19 DIAGNOSIS — M999 Biomechanical lesion, unspecified: Secondary | ICD-10-CM | POA: Diagnosis not present

## 2012-09-19 DIAGNOSIS — IMO0002 Reserved for concepts with insufficient information to code with codable children: Secondary | ICD-10-CM | POA: Diagnosis not present

## 2012-09-20 DIAGNOSIS — IMO0002 Reserved for concepts with insufficient information to code with codable children: Secondary | ICD-10-CM | POA: Diagnosis not present

## 2012-09-20 DIAGNOSIS — M999 Biomechanical lesion, unspecified: Secondary | ICD-10-CM | POA: Diagnosis not present

## 2012-09-20 DIAGNOSIS — M461 Sacroiliitis, not elsewhere classified: Secondary | ICD-10-CM | POA: Diagnosis not present

## 2012-09-24 HISTORY — PX: OTHER SURGICAL HISTORY: SHX169

## 2012-09-26 DIAGNOSIS — M999 Biomechanical lesion, unspecified: Secondary | ICD-10-CM | POA: Diagnosis not present

## 2012-09-26 DIAGNOSIS — IMO0002 Reserved for concepts with insufficient information to code with codable children: Secondary | ICD-10-CM | POA: Diagnosis not present

## 2012-09-26 DIAGNOSIS — M461 Sacroiliitis, not elsewhere classified: Secondary | ICD-10-CM | POA: Diagnosis not present

## 2012-10-02 DIAGNOSIS — M999 Biomechanical lesion, unspecified: Secondary | ICD-10-CM | POA: Diagnosis not present

## 2012-10-02 DIAGNOSIS — M461 Sacroiliitis, not elsewhere classified: Secondary | ICD-10-CM | POA: Diagnosis not present

## 2012-10-02 DIAGNOSIS — IMO0002 Reserved for concepts with insufficient information to code with codable children: Secondary | ICD-10-CM | POA: Diagnosis not present

## 2012-10-04 DIAGNOSIS — M999 Biomechanical lesion, unspecified: Secondary | ICD-10-CM | POA: Diagnosis not present

## 2012-10-04 DIAGNOSIS — IMO0002 Reserved for concepts with insufficient information to code with codable children: Secondary | ICD-10-CM | POA: Diagnosis not present

## 2012-10-04 DIAGNOSIS — M461 Sacroiliitis, not elsewhere classified: Secondary | ICD-10-CM | POA: Diagnosis not present

## 2012-10-09 DIAGNOSIS — IMO0002 Reserved for concepts with insufficient information to code with codable children: Secondary | ICD-10-CM | POA: Diagnosis not present

## 2012-10-09 DIAGNOSIS — M461 Sacroiliitis, not elsewhere classified: Secondary | ICD-10-CM | POA: Diagnosis not present

## 2012-10-09 DIAGNOSIS — M999 Biomechanical lesion, unspecified: Secondary | ICD-10-CM | POA: Diagnosis not present

## 2012-10-12 DIAGNOSIS — M999 Biomechanical lesion, unspecified: Secondary | ICD-10-CM | POA: Diagnosis not present

## 2012-10-12 DIAGNOSIS — M461 Sacroiliitis, not elsewhere classified: Secondary | ICD-10-CM | POA: Diagnosis not present

## 2012-10-12 DIAGNOSIS — IMO0002 Reserved for concepts with insufficient information to code with codable children: Secondary | ICD-10-CM | POA: Diagnosis not present

## 2012-10-17 DIAGNOSIS — M999 Biomechanical lesion, unspecified: Secondary | ICD-10-CM | POA: Diagnosis not present

## 2012-10-17 DIAGNOSIS — M461 Sacroiliitis, not elsewhere classified: Secondary | ICD-10-CM | POA: Diagnosis not present

## 2012-10-17 DIAGNOSIS — IMO0002 Reserved for concepts with insufficient information to code with codable children: Secondary | ICD-10-CM | POA: Diagnosis not present

## 2012-10-26 DIAGNOSIS — H906 Mixed conductive and sensorineural hearing loss, bilateral: Secondary | ICD-10-CM | POA: Diagnosis not present

## 2012-10-26 DIAGNOSIS — H6980 Other specified disorders of Eustachian tube, unspecified ear: Secondary | ICD-10-CM | POA: Diagnosis not present

## 2012-10-26 DIAGNOSIS — H7200 Central perforation of tympanic membrane, unspecified ear: Secondary | ICD-10-CM | POA: Diagnosis not present

## 2012-10-30 ENCOUNTER — Ambulatory Visit
Admission: RE | Admit: 2012-10-30 | Discharge: 2012-10-30 | Disposition: A | Discharge status: Home / Self Care | Payer: Medicare Other | Source: Ambulatory Visit | Attending: Family Medicine | Admitting: Family Medicine

## 2012-10-30 DIAGNOSIS — Z1231 Encounter for screening mammogram for malignant neoplasm of breast: Secondary | ICD-10-CM

## 2012-11-01 DIAGNOSIS — M999 Biomechanical lesion, unspecified: Secondary | ICD-10-CM | POA: Diagnosis not present

## 2012-11-01 DIAGNOSIS — M461 Sacroiliitis, not elsewhere classified: Secondary | ICD-10-CM | POA: Diagnosis not present

## 2012-11-01 DIAGNOSIS — IMO0002 Reserved for concepts with insufficient information to code with codable children: Secondary | ICD-10-CM | POA: Diagnosis not present

## 2012-11-27 DIAGNOSIS — H35369 Drusen (degenerative) of macula, unspecified eye: Secondary | ICD-10-CM | POA: Diagnosis not present

## 2012-11-27 DIAGNOSIS — H40019 Open angle with borderline findings, low risk, unspecified eye: Secondary | ICD-10-CM | POA: Diagnosis not present

## 2012-11-27 DIAGNOSIS — H2510 Age-related nuclear cataract, unspecified eye: Secondary | ICD-10-CM | POA: Diagnosis not present

## 2012-11-27 DIAGNOSIS — H43399 Other vitreous opacities, unspecified eye: Secondary | ICD-10-CM | POA: Diagnosis not present

## 2012-12-26 DIAGNOSIS — N3020 Other chronic cystitis without hematuria: Secondary | ICD-10-CM | POA: Diagnosis not present

## 2012-12-26 DIAGNOSIS — R319 Hematuria, unspecified: Secondary | ICD-10-CM | POA: Diagnosis not present

## 2012-12-26 DIAGNOSIS — R3129 Other microscopic hematuria: Secondary | ICD-10-CM | POA: Diagnosis not present

## 2012-12-26 DIAGNOSIS — N3090 Cystitis, unspecified without hematuria: Secondary | ICD-10-CM | POA: Diagnosis not present

## 2012-12-28 DIAGNOSIS — M461 Sacroiliitis, not elsewhere classified: Secondary | ICD-10-CM | POA: Diagnosis not present

## 2012-12-28 DIAGNOSIS — IMO0002 Reserved for concepts with insufficient information to code with codable children: Secondary | ICD-10-CM | POA: Diagnosis not present

## 2012-12-28 DIAGNOSIS — M999 Biomechanical lesion, unspecified: Secondary | ICD-10-CM | POA: Diagnosis not present

## 2013-01-01 DIAGNOSIS — M999 Biomechanical lesion, unspecified: Secondary | ICD-10-CM | POA: Diagnosis not present

## 2013-01-01 DIAGNOSIS — M461 Sacroiliitis, not elsewhere classified: Secondary | ICD-10-CM | POA: Diagnosis not present

## 2013-01-01 DIAGNOSIS — IMO0002 Reserved for concepts with insufficient information to code with codable children: Secondary | ICD-10-CM | POA: Diagnosis not present

## 2013-01-08 ENCOUNTER — Other Ambulatory Visit: Payer: Self-pay | Admitting: Orthopedic Surgery

## 2013-01-08 DIAGNOSIS — M25561 Pain in right knee: Secondary | ICD-10-CM

## 2013-01-10 ENCOUNTER — Ambulatory Visit
Admission: RE | Admit: 2013-01-10 | Discharge: 2013-01-10 | Disposition: A | Discharge status: Home / Self Care | Payer: Medicare Other | Source: Ambulatory Visit | Attending: Orthopedic Surgery | Admitting: Orthopedic Surgery

## 2013-01-10 DIAGNOSIS — M25469 Effusion, unspecified knee: Secondary | ICD-10-CM | POA: Diagnosis not present

## 2013-01-10 DIAGNOSIS — M1710 Unilateral primary osteoarthritis, unspecified knee: Secondary | ICD-10-CM | POA: Diagnosis not present

## 2013-01-10 DIAGNOSIS — M25561 Pain in right knee: Secondary | ICD-10-CM

## 2013-01-16 DIAGNOSIS — M461 Sacroiliitis, not elsewhere classified: Secondary | ICD-10-CM | POA: Diagnosis not present

## 2013-01-16 DIAGNOSIS — M999 Biomechanical lesion, unspecified: Secondary | ICD-10-CM | POA: Diagnosis not present

## 2013-01-16 DIAGNOSIS — M25569 Pain in unspecified knee: Secondary | ICD-10-CM | POA: Diagnosis not present

## 2013-01-16 DIAGNOSIS — IMO0002 Reserved for concepts with insufficient information to code with codable children: Secondary | ICD-10-CM | POA: Diagnosis not present

## 2013-01-22 DIAGNOSIS — M2240 Chondromalacia patellae, unspecified knee: Secondary | ICD-10-CM | POA: Diagnosis not present

## 2013-01-22 DIAGNOSIS — Y929 Unspecified place or not applicable: Secondary | ICD-10-CM | POA: Diagnosis not present

## 2013-01-22 DIAGNOSIS — M9420 Chondromalacia, unspecified site: Secondary | ICD-10-CM | POA: Diagnosis not present

## 2013-01-22 DIAGNOSIS — Y999 Unspecified external cause status: Secondary | ICD-10-CM | POA: Diagnosis not present

## 2013-01-22 DIAGNOSIS — S83289A Other tear of lateral meniscus, current injury, unspecified knee, initial encounter: Secondary | ICD-10-CM | POA: Diagnosis not present

## 2013-01-22 DIAGNOSIS — IMO0002 Reserved for concepts with insufficient information to code with codable children: Secondary | ICD-10-CM | POA: Diagnosis not present

## 2013-01-22 DIAGNOSIS — Y939 Activity, unspecified: Secondary | ICD-10-CM | POA: Diagnosis not present

## 2013-01-22 DIAGNOSIS — X58XXXA Exposure to other specified factors, initial encounter: Secondary | ICD-10-CM | POA: Diagnosis not present

## 2013-02-21 DIAGNOSIS — M461 Sacroiliitis, not elsewhere classified: Secondary | ICD-10-CM | POA: Diagnosis not present

## 2013-02-21 DIAGNOSIS — M999 Biomechanical lesion, unspecified: Secondary | ICD-10-CM | POA: Diagnosis not present

## 2013-02-21 DIAGNOSIS — IMO0002 Reserved for concepts with insufficient information to code with codable children: Secondary | ICD-10-CM | POA: Diagnosis not present

## 2013-03-05 DIAGNOSIS — M461 Sacroiliitis, not elsewhere classified: Secondary | ICD-10-CM | POA: Diagnosis not present

## 2013-03-05 DIAGNOSIS — M999 Biomechanical lesion, unspecified: Secondary | ICD-10-CM | POA: Diagnosis not present

## 2013-03-05 DIAGNOSIS — IMO0002 Reserved for concepts with insufficient information to code with codable children: Secondary | ICD-10-CM | POA: Diagnosis not present

## 2013-04-04 DIAGNOSIS — M461 Sacroiliitis, not elsewhere classified: Secondary | ICD-10-CM | POA: Diagnosis not present

## 2013-04-04 DIAGNOSIS — M999 Biomechanical lesion, unspecified: Secondary | ICD-10-CM | POA: Diagnosis not present

## 2013-04-04 DIAGNOSIS — IMO0002 Reserved for concepts with insufficient information to code with codable children: Secondary | ICD-10-CM | POA: Diagnosis not present

## 2013-04-17 DIAGNOSIS — M461 Sacroiliitis, not elsewhere classified: Secondary | ICD-10-CM | POA: Diagnosis not present

## 2013-04-17 DIAGNOSIS — IMO0002 Reserved for concepts with insufficient information to code with codable children: Secondary | ICD-10-CM | POA: Diagnosis not present

## 2013-04-17 DIAGNOSIS — M999 Biomechanical lesion, unspecified: Secondary | ICD-10-CM | POA: Diagnosis not present

## 2013-04-19 DIAGNOSIS — M461 Sacroiliitis, not elsewhere classified: Secondary | ICD-10-CM | POA: Diagnosis not present

## 2013-04-19 DIAGNOSIS — M999 Biomechanical lesion, unspecified: Secondary | ICD-10-CM | POA: Diagnosis not present

## 2013-04-19 DIAGNOSIS — IMO0002 Reserved for concepts with insufficient information to code with codable children: Secondary | ICD-10-CM | POA: Diagnosis not present

## 2013-05-16 DIAGNOSIS — M461 Sacroiliitis, not elsewhere classified: Secondary | ICD-10-CM | POA: Diagnosis not present

## 2013-05-16 DIAGNOSIS — IMO0002 Reserved for concepts with insufficient information to code with codable children: Secondary | ICD-10-CM | POA: Diagnosis not present

## 2013-05-16 DIAGNOSIS — M999 Biomechanical lesion, unspecified: Secondary | ICD-10-CM | POA: Diagnosis not present

## 2013-05-21 DIAGNOSIS — M461 Sacroiliitis, not elsewhere classified: Secondary | ICD-10-CM | POA: Diagnosis not present

## 2013-05-21 DIAGNOSIS — IMO0002 Reserved for concepts with insufficient information to code with codable children: Secondary | ICD-10-CM | POA: Diagnosis not present

## 2013-05-21 DIAGNOSIS — M999 Biomechanical lesion, unspecified: Secondary | ICD-10-CM | POA: Diagnosis not present

## 2013-06-21 DIAGNOSIS — M461 Sacroiliitis, not elsewhere classified: Secondary | ICD-10-CM | POA: Diagnosis not present

## 2013-06-21 DIAGNOSIS — IMO0002 Reserved for concepts with insufficient information to code with codable children: Secondary | ICD-10-CM | POA: Diagnosis not present

## 2013-06-21 DIAGNOSIS — M999 Biomechanical lesion, unspecified: Secondary | ICD-10-CM | POA: Diagnosis not present

## 2013-06-21 DIAGNOSIS — M2390 Unspecified internal derangement of unspecified knee: Secondary | ICD-10-CM | POA: Diagnosis not present

## 2013-06-21 DIAGNOSIS — M1610 Unilateral primary osteoarthritis, unspecified hip: Secondary | ICD-10-CM | POA: Diagnosis not present

## 2013-07-02 DIAGNOSIS — M999 Biomechanical lesion, unspecified: Secondary | ICD-10-CM | POA: Diagnosis not present

## 2013-07-02 DIAGNOSIS — M461 Sacroiliitis, not elsewhere classified: Secondary | ICD-10-CM | POA: Diagnosis not present

## 2013-07-02 DIAGNOSIS — IMO0002 Reserved for concepts with insufficient information to code with codable children: Secondary | ICD-10-CM | POA: Diagnosis not present

## 2013-07-02 DIAGNOSIS — M2390 Unspecified internal derangement of unspecified knee: Secondary | ICD-10-CM | POA: Diagnosis not present

## 2013-07-02 DIAGNOSIS — M1610 Unilateral primary osteoarthritis, unspecified hip: Secondary | ICD-10-CM | POA: Diagnosis not present

## 2013-07-04 DIAGNOSIS — Z23 Encounter for immunization: Secondary | ICD-10-CM | POA: Diagnosis not present

## 2013-07-04 DIAGNOSIS — R609 Edema, unspecified: Secondary | ICD-10-CM | POA: Diagnosis not present

## 2013-07-04 DIAGNOSIS — Z79899 Other long term (current) drug therapy: Secondary | ICD-10-CM | POA: Diagnosis not present

## 2013-07-11 DIAGNOSIS — B029 Zoster without complications: Secondary | ICD-10-CM | POA: Diagnosis not present

## 2013-07-12 DIAGNOSIS — M461 Sacroiliitis, not elsewhere classified: Secondary | ICD-10-CM | POA: Diagnosis not present

## 2013-07-12 DIAGNOSIS — IMO0002 Reserved for concepts with insufficient information to code with codable children: Secondary | ICD-10-CM | POA: Diagnosis not present

## 2013-07-12 DIAGNOSIS — M999 Biomechanical lesion, unspecified: Secondary | ICD-10-CM | POA: Diagnosis not present

## 2013-07-12 DIAGNOSIS — M1610 Unilateral primary osteoarthritis, unspecified hip: Secondary | ICD-10-CM | POA: Diagnosis not present

## 2013-07-12 DIAGNOSIS — M2390 Unspecified internal derangement of unspecified knee: Secondary | ICD-10-CM | POA: Diagnosis not present

## 2013-09-04 DIAGNOSIS — M999 Biomechanical lesion, unspecified: Secondary | ICD-10-CM | POA: Diagnosis not present

## 2013-09-04 DIAGNOSIS — IMO0002 Reserved for concepts with insufficient information to code with codable children: Secondary | ICD-10-CM | POA: Diagnosis not present

## 2013-09-04 DIAGNOSIS — M461 Sacroiliitis, not elsewhere classified: Secondary | ICD-10-CM | POA: Diagnosis not present

## 2013-09-04 DIAGNOSIS — M2390 Unspecified internal derangement of unspecified knee: Secondary | ICD-10-CM | POA: Diagnosis not present

## 2013-09-04 DIAGNOSIS — M1610 Unilateral primary osteoarthritis, unspecified hip: Secondary | ICD-10-CM | POA: Diagnosis not present

## 2013-09-11 DIAGNOSIS — M1610 Unilateral primary osteoarthritis, unspecified hip: Secondary | ICD-10-CM | POA: Diagnosis not present

## 2013-09-11 DIAGNOSIS — M999 Biomechanical lesion, unspecified: Secondary | ICD-10-CM | POA: Diagnosis not present

## 2013-09-11 DIAGNOSIS — IMO0002 Reserved for concepts with insufficient information to code with codable children: Secondary | ICD-10-CM | POA: Diagnosis not present

## 2013-09-11 DIAGNOSIS — M2390 Unspecified internal derangement of unspecified knee: Secondary | ICD-10-CM | POA: Diagnosis not present

## 2013-09-11 DIAGNOSIS — M461 Sacroiliitis, not elsewhere classified: Secondary | ICD-10-CM | POA: Diagnosis not present

## 2013-09-13 DIAGNOSIS — M999 Biomechanical lesion, unspecified: Secondary | ICD-10-CM | POA: Diagnosis not present

## 2013-09-13 DIAGNOSIS — M1610 Unilateral primary osteoarthritis, unspecified hip: Secondary | ICD-10-CM | POA: Diagnosis not present

## 2013-09-13 DIAGNOSIS — M461 Sacroiliitis, not elsewhere classified: Secondary | ICD-10-CM | POA: Diagnosis not present

## 2013-09-13 DIAGNOSIS — IMO0002 Reserved for concepts with insufficient information to code with codable children: Secondary | ICD-10-CM | POA: Diagnosis not present

## 2013-09-13 DIAGNOSIS — M2390 Unspecified internal derangement of unspecified knee: Secondary | ICD-10-CM | POA: Diagnosis not present

## 2013-09-25 DIAGNOSIS — M461 Sacroiliitis, not elsewhere classified: Secondary | ICD-10-CM | POA: Diagnosis not present

## 2013-09-25 DIAGNOSIS — M999 Biomechanical lesion, unspecified: Secondary | ICD-10-CM | POA: Diagnosis not present

## 2013-09-25 DIAGNOSIS — M1610 Unilateral primary osteoarthritis, unspecified hip: Secondary | ICD-10-CM | POA: Diagnosis not present

## 2013-09-25 DIAGNOSIS — IMO0002 Reserved for concepts with insufficient information to code with codable children: Secondary | ICD-10-CM | POA: Diagnosis not present

## 2013-09-25 DIAGNOSIS — M2390 Unspecified internal derangement of unspecified knee: Secondary | ICD-10-CM | POA: Diagnosis not present

## 2013-10-01 DIAGNOSIS — IMO0002 Reserved for concepts with insufficient information to code with codable children: Secondary | ICD-10-CM | POA: Diagnosis not present

## 2013-10-01 DIAGNOSIS — M2390 Unspecified internal derangement of unspecified knee: Secondary | ICD-10-CM | POA: Diagnosis not present

## 2013-10-01 DIAGNOSIS — M1610 Unilateral primary osteoarthritis, unspecified hip: Secondary | ICD-10-CM | POA: Diagnosis not present

## 2013-10-01 DIAGNOSIS — M461 Sacroiliitis, not elsewhere classified: Secondary | ICD-10-CM | POA: Diagnosis not present

## 2013-10-01 DIAGNOSIS — M999 Biomechanical lesion, unspecified: Secondary | ICD-10-CM | POA: Diagnosis not present

## 2013-10-04 DIAGNOSIS — M999 Biomechanical lesion, unspecified: Secondary | ICD-10-CM | POA: Diagnosis not present

## 2013-10-04 DIAGNOSIS — M1610 Unilateral primary osteoarthritis, unspecified hip: Secondary | ICD-10-CM | POA: Diagnosis not present

## 2013-10-04 DIAGNOSIS — M461 Sacroiliitis, not elsewhere classified: Secondary | ICD-10-CM | POA: Diagnosis not present

## 2013-10-04 DIAGNOSIS — M2390 Unspecified internal derangement of unspecified knee: Secondary | ICD-10-CM | POA: Diagnosis not present

## 2013-10-04 DIAGNOSIS — IMO0002 Reserved for concepts with insufficient information to code with codable children: Secondary | ICD-10-CM | POA: Diagnosis not present

## 2013-10-09 DIAGNOSIS — IMO0002 Reserved for concepts with insufficient information to code with codable children: Secondary | ICD-10-CM | POA: Diagnosis not present

## 2013-10-09 DIAGNOSIS — M999 Biomechanical lesion, unspecified: Secondary | ICD-10-CM | POA: Diagnosis not present

## 2013-10-09 DIAGNOSIS — M2390 Unspecified internal derangement of unspecified knee: Secondary | ICD-10-CM | POA: Diagnosis not present

## 2013-10-09 DIAGNOSIS — M461 Sacroiliitis, not elsewhere classified: Secondary | ICD-10-CM | POA: Diagnosis not present

## 2013-10-09 DIAGNOSIS — M1610 Unilateral primary osteoarthritis, unspecified hip: Secondary | ICD-10-CM | POA: Diagnosis not present

## 2013-11-13 DIAGNOSIS — IMO0002 Reserved for concepts with insufficient information to code with codable children: Secondary | ICD-10-CM | POA: Diagnosis not present

## 2013-11-13 DIAGNOSIS — M999 Biomechanical lesion, unspecified: Secondary | ICD-10-CM | POA: Diagnosis not present

## 2013-11-13 DIAGNOSIS — M2390 Unspecified internal derangement of unspecified knee: Secondary | ICD-10-CM | POA: Diagnosis not present

## 2013-12-06 DIAGNOSIS — M2390 Unspecified internal derangement of unspecified knee: Secondary | ICD-10-CM | POA: Diagnosis not present

## 2013-12-06 DIAGNOSIS — IMO0002 Reserved for concepts with insufficient information to code with codable children: Secondary | ICD-10-CM | POA: Diagnosis not present

## 2013-12-06 DIAGNOSIS — M999 Biomechanical lesion, unspecified: Secondary | ICD-10-CM | POA: Diagnosis not present

## 2013-12-10 DIAGNOSIS — IMO0002 Reserved for concepts with insufficient information to code with codable children: Secondary | ICD-10-CM | POA: Diagnosis not present

## 2013-12-10 DIAGNOSIS — M999 Biomechanical lesion, unspecified: Secondary | ICD-10-CM | POA: Diagnosis not present

## 2013-12-10 DIAGNOSIS — M2390 Unspecified internal derangement of unspecified knee: Secondary | ICD-10-CM | POA: Diagnosis not present

## 2013-12-25 DIAGNOSIS — IMO0002 Reserved for concepts with insufficient information to code with codable children: Secondary | ICD-10-CM | POA: Diagnosis not present

## 2013-12-25 DIAGNOSIS — M2390 Unspecified internal derangement of unspecified knee: Secondary | ICD-10-CM | POA: Diagnosis not present

## 2013-12-25 DIAGNOSIS — M999 Biomechanical lesion, unspecified: Secondary | ICD-10-CM | POA: Diagnosis not present

## 2013-12-27 DIAGNOSIS — R3915 Urgency of urination: Secondary | ICD-10-CM | POA: Diagnosis not present

## 2013-12-27 DIAGNOSIS — R82998 Other abnormal findings in urine: Secondary | ICD-10-CM | POA: Diagnosis not present

## 2013-12-27 DIAGNOSIS — N3090 Cystitis, unspecified without hematuria: Secondary | ICD-10-CM | POA: Insufficient documentation

## 2013-12-27 DIAGNOSIS — R319 Hematuria, unspecified: Secondary | ICD-10-CM | POA: Diagnosis not present

## 2013-12-31 DIAGNOSIS — IMO0002 Reserved for concepts with insufficient information to code with codable children: Secondary | ICD-10-CM | POA: Diagnosis not present

## 2013-12-31 DIAGNOSIS — M999 Biomechanical lesion, unspecified: Secondary | ICD-10-CM | POA: Diagnosis not present

## 2013-12-31 DIAGNOSIS — M2390 Unspecified internal derangement of unspecified knee: Secondary | ICD-10-CM | POA: Diagnosis not present

## 2014-01-02 DIAGNOSIS — M2390 Unspecified internal derangement of unspecified knee: Secondary | ICD-10-CM | POA: Diagnosis not present

## 2014-01-02 DIAGNOSIS — M999 Biomechanical lesion, unspecified: Secondary | ICD-10-CM | POA: Diagnosis not present

## 2014-01-02 DIAGNOSIS — IMO0002 Reserved for concepts with insufficient information to code with codable children: Secondary | ICD-10-CM | POA: Diagnosis not present

## 2014-01-17 DIAGNOSIS — R82998 Other abnormal findings in urine: Secondary | ICD-10-CM | POA: Diagnosis not present

## 2014-01-17 DIAGNOSIS — R319 Hematuria, unspecified: Secondary | ICD-10-CM | POA: Diagnosis not present

## 2014-03-04 DIAGNOSIS — M2390 Unspecified internal derangement of unspecified knee: Secondary | ICD-10-CM | POA: Diagnosis not present

## 2014-03-04 DIAGNOSIS — M999 Biomechanical lesion, unspecified: Secondary | ICD-10-CM | POA: Diagnosis not present

## 2014-03-04 DIAGNOSIS — IMO0002 Reserved for concepts with insufficient information to code with codable children: Secondary | ICD-10-CM | POA: Diagnosis not present

## 2014-03-27 DIAGNOSIS — H35369 Drusen (degenerative) of macula, unspecified eye: Secondary | ICD-10-CM | POA: Diagnosis not present

## 2014-03-27 DIAGNOSIS — H524 Presbyopia: Secondary | ICD-10-CM | POA: Diagnosis not present

## 2014-03-27 DIAGNOSIS — H2510 Age-related nuclear cataract, unspecified eye: Secondary | ICD-10-CM | POA: Diagnosis not present

## 2014-03-27 DIAGNOSIS — H40019 Open angle with borderline findings, low risk, unspecified eye: Secondary | ICD-10-CM | POA: Diagnosis not present

## 2014-04-11 DIAGNOSIS — IMO0002 Reserved for concepts with insufficient information to code with codable children: Secondary | ICD-10-CM | POA: Diagnosis not present

## 2014-04-11 DIAGNOSIS — M999 Biomechanical lesion, unspecified: Secondary | ICD-10-CM | POA: Diagnosis not present

## 2014-04-11 DIAGNOSIS — M2390 Unspecified internal derangement of unspecified knee: Secondary | ICD-10-CM | POA: Diagnosis not present

## 2014-04-30 DIAGNOSIS — M2390 Unspecified internal derangement of unspecified knee: Secondary | ICD-10-CM | POA: Diagnosis not present

## 2014-04-30 DIAGNOSIS — IMO0002 Reserved for concepts with insufficient information to code with codable children: Secondary | ICD-10-CM | POA: Diagnosis not present

## 2014-04-30 DIAGNOSIS — M999 Biomechanical lesion, unspecified: Secondary | ICD-10-CM | POA: Diagnosis not present

## 2014-05-07 DIAGNOSIS — M25569 Pain in unspecified knee: Secondary | ICD-10-CM | POA: Diagnosis not present

## 2014-06-11 DIAGNOSIS — M2390 Unspecified internal derangement of unspecified knee: Secondary | ICD-10-CM | POA: Diagnosis not present

## 2014-06-11 DIAGNOSIS — M999 Biomechanical lesion, unspecified: Secondary | ICD-10-CM | POA: Diagnosis not present

## 2014-06-11 DIAGNOSIS — IMO0002 Reserved for concepts with insufficient information to code with codable children: Secondary | ICD-10-CM | POA: Diagnosis not present

## 2014-07-02 ENCOUNTER — Other Ambulatory Visit: Payer: Self-pay

## 2014-07-02 DIAGNOSIS — Z1231 Encounter for screening mammogram for malignant neoplasm of breast: Secondary | ICD-10-CM

## 2014-07-03 DIAGNOSIS — M999 Biomechanical lesion, unspecified: Secondary | ICD-10-CM | POA: Diagnosis not present

## 2014-07-03 DIAGNOSIS — M2390 Unspecified internal derangement of unspecified knee: Secondary | ICD-10-CM | POA: Diagnosis not present

## 2014-07-03 DIAGNOSIS — IMO0002 Reserved for concepts with insufficient information to code with codable children: Secondary | ICD-10-CM | POA: Diagnosis not present

## 2014-07-19 ENCOUNTER — Ambulatory Visit
Admission: RE | Admit: 2014-07-19 | Discharge: 2014-07-19 | Disposition: A | Discharge status: Home / Self Care | Payer: Medicare Other | Source: Ambulatory Visit

## 2014-07-19 DIAGNOSIS — Z1231 Encounter for screening mammogram for malignant neoplasm of breast: Secondary | ICD-10-CM | POA: Diagnosis not present

## 2014-07-23 DIAGNOSIS — N318 Other neuromuscular dysfunction of bladder: Secondary | ICD-10-CM | POA: Diagnosis not present

## 2014-07-23 DIAGNOSIS — R350 Frequency of micturition: Secondary | ICD-10-CM | POA: Diagnosis not present

## 2014-07-23 DIAGNOSIS — N3020 Other chronic cystitis without hematuria: Secondary | ICD-10-CM | POA: Diagnosis not present

## 2014-07-23 DIAGNOSIS — R319 Hematuria, unspecified: Secondary | ICD-10-CM | POA: Diagnosis not present

## 2014-07-23 DIAGNOSIS — R3129 Other microscopic hematuria: Secondary | ICD-10-CM | POA: Diagnosis not present

## 2014-08-01 DIAGNOSIS — Z1211 Encounter for screening for malignant neoplasm of colon: Secondary | ICD-10-CM | POA: Diagnosis not present

## 2014-08-01 DIAGNOSIS — M9903 Segmental and somatic dysfunction of lumbar region: Secondary | ICD-10-CM | POA: Diagnosis not present

## 2014-08-01 DIAGNOSIS — M5416 Radiculopathy, lumbar region: Secondary | ICD-10-CM | POA: Diagnosis not present

## 2014-08-01 DIAGNOSIS — M238X2 Other internal derangements of left knee: Secondary | ICD-10-CM | POA: Diagnosis not present

## 2014-08-01 DIAGNOSIS — M238X1 Other internal derangements of right knee: Secondary | ICD-10-CM | POA: Diagnosis not present

## 2014-08-08 DIAGNOSIS — H2513 Age-related nuclear cataract, bilateral: Secondary | ICD-10-CM | POA: Diagnosis not present

## 2014-08-08 DIAGNOSIS — H1013 Acute atopic conjunctivitis, bilateral: Secondary | ICD-10-CM | POA: Diagnosis not present

## 2014-08-08 DIAGNOSIS — H40013 Open angle with borderline findings, low risk, bilateral: Secondary | ICD-10-CM | POA: Diagnosis not present

## 2014-08-08 DIAGNOSIS — H25013 Cortical age-related cataract, bilateral: Secondary | ICD-10-CM | POA: Diagnosis not present

## 2014-08-21 DIAGNOSIS — Z23 Encounter for immunization: Secondary | ICD-10-CM | POA: Diagnosis not present

## 2014-08-26 DIAGNOSIS — M5416 Radiculopathy, lumbar region: Secondary | ICD-10-CM | POA: Diagnosis not present

## 2014-08-26 DIAGNOSIS — M238X1 Other internal derangements of right knee: Secondary | ICD-10-CM | POA: Diagnosis not present

## 2014-08-26 DIAGNOSIS — M238X2 Other internal derangements of left knee: Secondary | ICD-10-CM | POA: Diagnosis not present

## 2014-08-26 DIAGNOSIS — M9903 Segmental and somatic dysfunction of lumbar region: Secondary | ICD-10-CM | POA: Diagnosis not present

## 2014-10-07 DIAGNOSIS — M5416 Radiculopathy, lumbar region: Secondary | ICD-10-CM | POA: Diagnosis not present

## 2014-10-07 DIAGNOSIS — M238X2 Other internal derangements of left knee: Secondary | ICD-10-CM | POA: Diagnosis not present

## 2014-10-07 DIAGNOSIS — M238X1 Other internal derangements of right knee: Secondary | ICD-10-CM | POA: Diagnosis not present

## 2014-10-07 DIAGNOSIS — M9903 Segmental and somatic dysfunction of lumbar region: Secondary | ICD-10-CM | POA: Diagnosis not present

## 2014-10-10 DIAGNOSIS — M9903 Segmental and somatic dysfunction of lumbar region: Secondary | ICD-10-CM | POA: Diagnosis not present

## 2014-10-10 DIAGNOSIS — M238X1 Other internal derangements of right knee: Secondary | ICD-10-CM | POA: Diagnosis not present

## 2014-10-10 DIAGNOSIS — M9901 Segmental and somatic dysfunction of cervical region: Secondary | ICD-10-CM | POA: Diagnosis not present

## 2014-10-10 DIAGNOSIS — M5416 Radiculopathy, lumbar region: Secondary | ICD-10-CM | POA: Diagnosis not present

## 2014-10-10 DIAGNOSIS — M9902 Segmental and somatic dysfunction of thoracic region: Secondary | ICD-10-CM | POA: Diagnosis not present

## 2014-10-10 DIAGNOSIS — M792 Neuralgia and neuritis, unspecified: Secondary | ICD-10-CM | POA: Diagnosis not present

## 2014-10-10 DIAGNOSIS — M5412 Radiculopathy, cervical region: Secondary | ICD-10-CM | POA: Diagnosis not present

## 2014-10-10 DIAGNOSIS — M238X2 Other internal derangements of left knee: Secondary | ICD-10-CM | POA: Diagnosis not present

## 2014-11-27 DIAGNOSIS — M238X2 Other internal derangements of left knee: Secondary | ICD-10-CM | POA: Diagnosis not present

## 2014-11-27 DIAGNOSIS — M238X1 Other internal derangements of right knee: Secondary | ICD-10-CM | POA: Diagnosis not present

## 2014-11-27 DIAGNOSIS — M5416 Radiculopathy, lumbar region: Secondary | ICD-10-CM | POA: Diagnosis not present

## 2014-11-27 DIAGNOSIS — M9903 Segmental and somatic dysfunction of lumbar region: Secondary | ICD-10-CM | POA: Diagnosis not present

## 2014-12-17 DIAGNOSIS — M792 Neuralgia and neuritis, unspecified: Secondary | ICD-10-CM | POA: Diagnosis not present

## 2014-12-17 DIAGNOSIS — M9901 Segmental and somatic dysfunction of cervical region: Secondary | ICD-10-CM | POA: Diagnosis not present

## 2014-12-17 DIAGNOSIS — M238X2 Other internal derangements of left knee: Secondary | ICD-10-CM | POA: Diagnosis not present

## 2014-12-17 DIAGNOSIS — M238X1 Other internal derangements of right knee: Secondary | ICD-10-CM | POA: Diagnosis not present

## 2014-12-17 DIAGNOSIS — M5412 Radiculopathy, cervical region: Secondary | ICD-10-CM | POA: Diagnosis not present

## 2014-12-17 DIAGNOSIS — M5416 Radiculopathy, lumbar region: Secondary | ICD-10-CM | POA: Diagnosis not present

## 2014-12-17 DIAGNOSIS — M9902 Segmental and somatic dysfunction of thoracic region: Secondary | ICD-10-CM | POA: Diagnosis not present

## 2014-12-17 DIAGNOSIS — M9903 Segmental and somatic dysfunction of lumbar region: Secondary | ICD-10-CM | POA: Diagnosis not present

## 2014-12-23 DIAGNOSIS — M2041 Other hammer toe(s) (acquired), right foot: Secondary | ICD-10-CM | POA: Diagnosis not present

## 2014-12-23 DIAGNOSIS — M2011 Hallux valgus (acquired), right foot: Secondary | ICD-10-CM | POA: Diagnosis not present

## 2014-12-23 DIAGNOSIS — M2012 Hallux valgus (acquired), left foot: Secondary | ICD-10-CM | POA: Diagnosis not present

## 2014-12-23 DIAGNOSIS — M2042 Other hammer toe(s) (acquired), left foot: Secondary | ICD-10-CM | POA: Diagnosis not present

## 2014-12-31 DIAGNOSIS — M2041 Other hammer toe(s) (acquired), right foot: Secondary | ICD-10-CM | POA: Diagnosis not present

## 2014-12-31 DIAGNOSIS — M7741 Metatarsalgia, right foot: Secondary | ICD-10-CM | POA: Diagnosis not present

## 2015-01-21 DIAGNOSIS — M2061 Acquired deformities of toe(s), unspecified, right foot: Secondary | ICD-10-CM | POA: Diagnosis not present

## 2015-01-21 DIAGNOSIS — G8918 Other acute postprocedural pain: Secondary | ICD-10-CM | POA: Diagnosis not present

## 2015-01-21 DIAGNOSIS — L84 Corns and callosities: Secondary | ICD-10-CM | POA: Diagnosis not present

## 2015-01-21 DIAGNOSIS — M2041 Other hammer toe(s) (acquired), right foot: Secondary | ICD-10-CM | POA: Diagnosis not present

## 2015-03-31 DIAGNOSIS — H2513 Age-related nuclear cataract, bilateral: Secondary | ICD-10-CM | POA: Diagnosis not present

## 2015-03-31 DIAGNOSIS — H40013 Open angle with borderline findings, low risk, bilateral: Secondary | ICD-10-CM | POA: Diagnosis not present

## 2015-03-31 DIAGNOSIS — H25013 Cortical age-related cataract, bilateral: Secondary | ICD-10-CM | POA: Diagnosis not present

## 2015-06-25 ENCOUNTER — Encounter: Payer: Self-pay | Admitting: Cardiology

## 2015-06-25 DIAGNOSIS — Z79899 Other long term (current) drug therapy: Secondary | ICD-10-CM | POA: Diagnosis not present

## 2015-06-25 DIAGNOSIS — R609 Edema, unspecified: Secondary | ICD-10-CM | POA: Diagnosis not present

## 2015-06-25 DIAGNOSIS — Z13820 Encounter for screening for osteoporosis: Secondary | ICD-10-CM | POA: Diagnosis not present

## 2015-06-25 DIAGNOSIS — K219 Gastro-esophageal reflux disease without esophagitis: Secondary | ICD-10-CM | POA: Diagnosis not present

## 2015-06-25 DIAGNOSIS — Z23 Encounter for immunization: Secondary | ICD-10-CM | POA: Diagnosis not present

## 2015-06-25 DIAGNOSIS — E785 Hyperlipidemia, unspecified: Secondary | ICD-10-CM | POA: Diagnosis not present

## 2015-07-16 DIAGNOSIS — Z780 Asymptomatic menopausal state: Secondary | ICD-10-CM | POA: Diagnosis not present

## 2015-07-30 DIAGNOSIS — Z23 Encounter for immunization: Secondary | ICD-10-CM | POA: Diagnosis not present

## 2015-08-25 DIAGNOSIS — Z87448 Personal history of other diseases of urinary system: Secondary | ICD-10-CM | POA: Diagnosis not present

## 2015-08-25 DIAGNOSIS — R319 Hematuria, unspecified: Secondary | ICD-10-CM | POA: Diagnosis not present

## 2015-08-25 DIAGNOSIS — R828 Abnormal findings on cytological and histological examination of urine: Secondary | ICD-10-CM | POA: Diagnosis not present

## 2015-08-25 DIAGNOSIS — R3129 Other microscopic hematuria: Secondary | ICD-10-CM | POA: Diagnosis not present

## 2015-08-25 DIAGNOSIS — Z87440 Personal history of urinary (tract) infections: Secondary | ICD-10-CM | POA: Diagnosis not present

## 2015-08-25 DIAGNOSIS — R3915 Urgency of urination: Secondary | ICD-10-CM | POA: Diagnosis not present

## 2015-09-30 DIAGNOSIS — H40013 Open angle with borderline findings, low risk, bilateral: Secondary | ICD-10-CM | POA: Diagnosis not present

## 2016-01-19 DIAGNOSIS — M9904 Segmental and somatic dysfunction of sacral region: Secondary | ICD-10-CM | POA: Diagnosis not present

## 2016-01-19 DIAGNOSIS — M62830 Muscle spasm of back: Secondary | ICD-10-CM | POA: Diagnosis not present

## 2016-01-19 DIAGNOSIS — M9902 Segmental and somatic dysfunction of thoracic region: Secondary | ICD-10-CM | POA: Diagnosis not present

## 2016-01-19 DIAGNOSIS — M9913 Subluxation complex (vertebral) of lumbar region: Secondary | ICD-10-CM | POA: Diagnosis not present

## 2016-01-20 DIAGNOSIS — M62830 Muscle spasm of back: Secondary | ICD-10-CM | POA: Diagnosis not present

## 2016-01-20 DIAGNOSIS — M9913 Subluxation complex (vertebral) of lumbar region: Secondary | ICD-10-CM | POA: Diagnosis not present

## 2016-01-20 DIAGNOSIS — M9902 Segmental and somatic dysfunction of thoracic region: Secondary | ICD-10-CM | POA: Diagnosis not present

## 2016-01-20 DIAGNOSIS — M9904 Segmental and somatic dysfunction of sacral region: Secondary | ICD-10-CM | POA: Diagnosis not present

## 2016-01-21 DIAGNOSIS — M9913 Subluxation complex (vertebral) of lumbar region: Secondary | ICD-10-CM | POA: Diagnosis not present

## 2016-01-21 DIAGNOSIS — M9902 Segmental and somatic dysfunction of thoracic region: Secondary | ICD-10-CM | POA: Diagnosis not present

## 2016-01-21 DIAGNOSIS — M9904 Segmental and somatic dysfunction of sacral region: Secondary | ICD-10-CM | POA: Diagnosis not present

## 2016-01-21 DIAGNOSIS — M62830 Muscle spasm of back: Secondary | ICD-10-CM | POA: Diagnosis not present

## 2016-01-28 DIAGNOSIS — M9913 Subluxation complex (vertebral) of lumbar region: Secondary | ICD-10-CM | POA: Diagnosis not present

## 2016-01-28 DIAGNOSIS — M62830 Muscle spasm of back: Secondary | ICD-10-CM | POA: Diagnosis not present

## 2016-01-28 DIAGNOSIS — M9902 Segmental and somatic dysfunction of thoracic region: Secondary | ICD-10-CM | POA: Diagnosis not present

## 2016-01-28 DIAGNOSIS — M9904 Segmental and somatic dysfunction of sacral region: Secondary | ICD-10-CM | POA: Diagnosis not present

## 2016-01-29 DIAGNOSIS — M9913 Subluxation complex (vertebral) of lumbar region: Secondary | ICD-10-CM | POA: Diagnosis not present

## 2016-01-29 DIAGNOSIS — M62830 Muscle spasm of back: Secondary | ICD-10-CM | POA: Diagnosis not present

## 2016-01-29 DIAGNOSIS — M9904 Segmental and somatic dysfunction of sacral region: Secondary | ICD-10-CM | POA: Diagnosis not present

## 2016-01-29 DIAGNOSIS — M9902 Segmental and somatic dysfunction of thoracic region: Secondary | ICD-10-CM | POA: Diagnosis not present

## 2016-02-11 DIAGNOSIS — M9913 Subluxation complex (vertebral) of lumbar region: Secondary | ICD-10-CM | POA: Diagnosis not present

## 2016-02-11 DIAGNOSIS — M62830 Muscle spasm of back: Secondary | ICD-10-CM | POA: Diagnosis not present

## 2016-02-11 DIAGNOSIS — M9904 Segmental and somatic dysfunction of sacral region: Secondary | ICD-10-CM | POA: Diagnosis not present

## 2016-02-11 DIAGNOSIS — M9902 Segmental and somatic dysfunction of thoracic region: Secondary | ICD-10-CM | POA: Diagnosis not present

## 2016-02-12 DIAGNOSIS — M9902 Segmental and somatic dysfunction of thoracic region: Secondary | ICD-10-CM | POA: Diagnosis not present

## 2016-02-12 DIAGNOSIS — M9904 Segmental and somatic dysfunction of sacral region: Secondary | ICD-10-CM | POA: Diagnosis not present

## 2016-02-12 DIAGNOSIS — M62830 Muscle spasm of back: Secondary | ICD-10-CM | POA: Diagnosis not present

## 2016-02-12 DIAGNOSIS — M9913 Subluxation complex (vertebral) of lumbar region: Secondary | ICD-10-CM | POA: Diagnosis not present

## 2016-02-16 DIAGNOSIS — K045 Chronic apical periodontitis: Secondary | ICD-10-CM | POA: Diagnosis not present

## 2016-02-17 DIAGNOSIS — M9913 Subluxation complex (vertebral) of lumbar region: Secondary | ICD-10-CM | POA: Diagnosis not present

## 2016-02-17 DIAGNOSIS — M9902 Segmental and somatic dysfunction of thoracic region: Secondary | ICD-10-CM | POA: Diagnosis not present

## 2016-02-17 DIAGNOSIS — M9904 Segmental and somatic dysfunction of sacral region: Secondary | ICD-10-CM | POA: Diagnosis not present

## 2016-02-17 DIAGNOSIS — M62830 Muscle spasm of back: Secondary | ICD-10-CM | POA: Diagnosis not present

## 2016-02-19 DIAGNOSIS — M9904 Segmental and somatic dysfunction of sacral region: Secondary | ICD-10-CM | POA: Diagnosis not present

## 2016-02-19 DIAGNOSIS — M9902 Segmental and somatic dysfunction of thoracic region: Secondary | ICD-10-CM | POA: Diagnosis not present

## 2016-02-19 DIAGNOSIS — M62830 Muscle spasm of back: Secondary | ICD-10-CM | POA: Diagnosis not present

## 2016-02-19 DIAGNOSIS — M9913 Subluxation complex (vertebral) of lumbar region: Secondary | ICD-10-CM | POA: Diagnosis not present

## 2016-02-23 DIAGNOSIS — M9902 Segmental and somatic dysfunction of thoracic region: Secondary | ICD-10-CM | POA: Diagnosis not present

## 2016-02-23 DIAGNOSIS — M9904 Segmental and somatic dysfunction of sacral region: Secondary | ICD-10-CM | POA: Diagnosis not present

## 2016-02-23 DIAGNOSIS — M62830 Muscle spasm of back: Secondary | ICD-10-CM | POA: Diagnosis not present

## 2016-02-23 DIAGNOSIS — M9913 Subluxation complex (vertebral) of lumbar region: Secondary | ICD-10-CM | POA: Diagnosis not present

## 2016-02-25 DIAGNOSIS — M9913 Subluxation complex (vertebral) of lumbar region: Secondary | ICD-10-CM | POA: Diagnosis not present

## 2016-02-25 DIAGNOSIS — M62830 Muscle spasm of back: Secondary | ICD-10-CM | POA: Diagnosis not present

## 2016-02-25 DIAGNOSIS — M9904 Segmental and somatic dysfunction of sacral region: Secondary | ICD-10-CM | POA: Diagnosis not present

## 2016-02-25 DIAGNOSIS — M9902 Segmental and somatic dysfunction of thoracic region: Secondary | ICD-10-CM | POA: Diagnosis not present

## 2016-04-01 DIAGNOSIS — H40013 Open angle with borderline findings, low risk, bilateral: Secondary | ICD-10-CM | POA: Diagnosis not present

## 2016-04-01 DIAGNOSIS — H2513 Age-related nuclear cataract, bilateral: Secondary | ICD-10-CM | POA: Diagnosis not present

## 2016-04-01 DIAGNOSIS — H353112 Nonexudative age-related macular degeneration, right eye, intermediate dry stage: Secondary | ICD-10-CM | POA: Diagnosis not present

## 2016-04-01 DIAGNOSIS — H353122 Nonexudative age-related macular degeneration, left eye, intermediate dry stage: Secondary | ICD-10-CM | POA: Diagnosis not present

## 2016-05-12 DIAGNOSIS — R350 Frequency of micturition: Secondary | ICD-10-CM | POA: Diagnosis not present

## 2016-05-19 DIAGNOSIS — R350 Frequency of micturition: Secondary | ICD-10-CM | POA: Diagnosis not present

## 2016-05-19 DIAGNOSIS — R3915 Urgency of urination: Secondary | ICD-10-CM | POA: Diagnosis not present

## 2016-07-02 DIAGNOSIS — Z23 Encounter for immunization: Secondary | ICD-10-CM | POA: Diagnosis not present

## 2016-07-02 DIAGNOSIS — R002 Palpitations: Secondary | ICD-10-CM | POA: Diagnosis not present

## 2016-08-05 ENCOUNTER — Encounter: Payer: Self-pay | Admitting: *Deleted

## 2016-08-18 ENCOUNTER — Encounter: Payer: Self-pay | Admitting: Cardiology

## 2016-08-18 ENCOUNTER — Ambulatory Visit (INDEPENDENT_AMBULATORY_CARE_PROVIDER_SITE_OTHER): Payer: Medicare Other | Admitting: Cardiology

## 2016-08-18 VITALS — BP 130/76 | HR 90 | Ht 63.0 in | Wt 151.4 lb

## 2016-08-18 DIAGNOSIS — R002 Palpitations: Secondary | ICD-10-CM

## 2016-08-18 DIAGNOSIS — I493 Ventricular premature depolarization: Secondary | ICD-10-CM | POA: Diagnosis not present

## 2016-08-18 DIAGNOSIS — R9431 Abnormal electrocardiogram [ECG] [EKG]: Secondary | ICD-10-CM | POA: Diagnosis not present

## 2016-08-18 MED ORDER — METOPROLOL SUCCINATE ER 25 MG PO TB24: 25.0000 mg | ORAL_TABLET | Freq: Every day | ORAL | 3 refills | Status: DC

## 2016-08-18 NOTE — Patient Instructions (Signed)
Your physician has recommended you make the following change in your medication:  1.) start metoprolol succinate (Toprol XL) 25 mg--one tablet once a day  Your physician has requested that you have an echocardiogram. Echocardiography is a painless test that uses sound waves to create images of your heart. It provides your doctor with information about the size and shape of your heart and how well your heart's chambers and valves are working. This procedure takes approximately one hour. There are no restrictions for this procedure.  Your physician has recommended that you wear a holter monitor. Holter monitors are medical devices that record the heart's electrical activity. Doctors most often use these monitors to diagnose arrhythmias. Arrhythmias are problems with the speed or rhythm of the heartbeat. The monitor is a small, portable device. You can wear one while you do your normal daily activities. This is usually used to diagnose what is causing palpitations/syncope (passing out).  Your physician recommends that you schedule a follow-up appointment in: 1 month with Dr. Marlou Porch or APP on day Dr. Marlou Porch is here.

## 2016-08-18 NOTE — Progress Notes (Signed)
Cardiology Office Note    Date:  08/18/2016   ID:  Brenda Hunter, DOB 06-24-1935, MRN [id number]  PCP:  [name], MD  Cardiologist:   [name], MD     History of Present Illness:  Brenda Hunter is a 81 y.o. female here for evaluation of palpitations, possible LVH on EKG at the request of Dr. [name].  On 07/02/16 she visited Dr. [name] with complaints of heart fluttering, palpitations over the last month felt like a "fluttering "like sensation for many seconds. She's had "skipped beat" at times happening a few times per hour. She did not describe any chest pain, no syncope. Perhaps she had more chocolate containing caffeine but no Sudafed or other decongestant use.  Over 10 years ago she had complaints of palpitations and wore a heart monitor and was told that she had "extra beats , 2007"and was treated with atenolol. Stopped it. Went away.   Now feels nervous with it, mild nausea. Also has duodenal problem.   An EKG was performed at Dr. Eddie Dibbles office with a rhythm strip and showed unifocal PVCs. LVH was noted as well.  She has never been a smoker. No diabetes. No early family history of coronary artery disease. Her LDL cholesterol is 110, HDL 88, excellent. Hemoglobin 14, potassium 4.5, creatinine 0.89.  I take care of her husband.    Past Medical History:  Diagnosis Date  . GERD (gastroesophageal reflux disease)   . Hematuria    Bladder Spasms (Dr. [name])  . Hiatal hernia   . History of rectal polyps   . Peripheral edema     Past Surgical History:  Procedure Laterality Date  . CYSTECTOMY     left breats benign  . HAMMER TOE SURGERY     right foot 2016  . KNEE ARTHROSCOPY     right knee  . RECTAL POLYPECTOMY     rectal polyps  . ROTATOR CUFF REPAIR     right shoulder  . TOTAL ABDOMINAL HYSTERECTOMY     2001    Current Medications: Outpatient Medications Prior to Visit  Medication Sig Dispense Refill  . omeprazole (PRILOSEC) 20 MG  capsule Take 20 mg by mouth daily as needed (acid reflux).    . OXYBUTYNIN CHLORIDE PO Take 10 mg by mouth as needed (over active bladder).    . trimethoprim (TRIMPEX) 100 MG tablet Take 100 mg by mouth as needed (infection).    . triamterene-hydrochlorothiazide (DYAZIDE) 37.5-25 MG capsule Take 1 capsule by mouth daily.    . valACYclovir (VALTREX) 1000 MG tablet Take 1,000 mg by mouth 3 (three) times daily.     No facility-administered medications prior to visit.      Allergies:   Macrodantin [nitrofurantoin macrocrystal] and Percocet [oxycodone-acetaminophen]   Social History   Social History  . Marital status: Married    Spouse name: [name]  . Number of children: 2  . Years of education: N/A   Occupational History  . retired    Social History Main Topics  . Smoking status: Never Smoker  . Smokeless tobacco: Never Used  . Alcohol use No  . Drug use: No  . Sexual activity: Not Asked   Other Topics Concern  . None   Social History Narrative  . None     Family History:  The patient's family history includes Diverticulitis in her sister; Heart Problems in her father; Heart attack in her mother; Heart disease in her mother; Kidney failure in her sister;  Lung cancer in her sister; Prostate cancer in her father; Renal Disease in her sister.   ROS:   Please see the history of present illness.    ROS All other systems reviewed and are negative.   PHYSICAL EXAM:   VS:  BP 130/76   Pulse 90   Ht 5\' 3"  (1.6 m)   Wt 151 lb 6.4 oz (68.7 kg)   BMI 26.82 kg/m    GEN: Well nourished, well developed, in no acute distress  HEENT: normal  Neck: no JVD, carotid bruits, or masses Cardiac: RRR; no murmurs, rubs, or gallops,no edema  Respiratory:  clear to auscultation bilaterally, normal work of breathing GI: soft, nontender, nondistended, + BS MS: no deformity or atrophy  Skin: warm and dry, no rash Neuro:  Alert and Oriented x 3, Strength and sensation are intact Psych:  euthymic mood, full affect  Wt Readings from Last 3 Encounters:  08/18/16 151 lb 6.4 oz (68.7 kg)      Studies/Labs Reviewed:   EKG:  EKG is ordered today.  The ekg ordered today demonstrates 08/18/16-sinus rhythm heart rate 90 with PVC, upright in 1, 2, 3. Left anterior fascicular block, poor R-wave progression, left ventricular hypertrophy. Personally viewed-prior described as above.  Recent Labs: No results found for requested labs within last 8760 hours.   Lipid Panel No results found for: CHOL, TRIG, HDL, CHOLHDL, VLDL, LDLCALC, LDLDIRECT  Additional studies/ records that were reviewed today include:  Prior office notes, lab work reviewed.    ASSESSMENT:    1. Palpitations   2. Abnormal EKG   3. PVC (premature ventricular contraction)      PLAN:  In order of problems listed above:  PVCs/palpitations  - We will check it 24-hour Holter monitor to officially count the number PVCs to ensure that they are not exceeding 10-15,000.  - Avoid decongestants, Sudafed, stimulants  - We will give her Toprol-XL 25 mg once a day. She is to take atenolol.  - Reassurance if PVCs alone  Abnormal EKG/LVH  - We will check an echocardiogram to interpret her structure and function of her heart.      Medication Adjustments/Labs and Tests Ordered: Current medicines are reviewed at length with the patient today.  Concerns regarding medicines are outlined above.  Medication changes, Labs and Tests ordered today are listed in the Patient Instructions below. Patient Instructions  Your physician has recommended you make the following change in your medication:  1.) start metoprolol succinate (Toprol XL) 25 mg--one tablet once a day  Your physician has requested that you have an echocardiogram. Echocardiography is a painless test that uses sound waves to create images of your heart. It provides your doctor with information about the size and shape of your heart and how well your heart's  chambers and valves are working. This procedure takes approximately one hour. There are no restrictions for this procedure.  Your physician has recommended that you wear a holter monitor. Holter monitors are medical devices that record the heart's electrical activity. Doctors most often use these monitors to diagnose arrhythmias. Arrhythmias are problems with the speed or rhythm of the heartbeat. The monitor is a small, portable device. You can wear one while you do your normal daily activities. This is usually used to diagnose what is causing palpitations/syncope (passing out).  Your physician recommends that you schedule a follow-up appointment in: 1 month with Dr. Marlou Porch or APP on day Dr. Marlou Porch is here.      Signed,  [name], MD  08/18/2016 12:39 PM    [address], [place], [place]  [address] Phone: [phone]; Fax: [phone]

## 2016-08-24 DIAGNOSIS — R3129 Other microscopic hematuria: Secondary | ICD-10-CM | POA: Diagnosis not present

## 2016-08-24 DIAGNOSIS — R3915 Urgency of urination: Secondary | ICD-10-CM | POA: Diagnosis not present

## 2016-08-24 DIAGNOSIS — N281 Cyst of kidney, acquired: Secondary | ICD-10-CM | POA: Diagnosis not present

## 2016-08-24 DIAGNOSIS — N390 Urinary tract infection, site not specified: Secondary | ICD-10-CM | POA: Diagnosis not present

## 2016-08-24 DIAGNOSIS — R350 Frequency of micturition: Secondary | ICD-10-CM | POA: Diagnosis not present

## 2016-09-06 ENCOUNTER — Other Ambulatory Visit: Payer: Self-pay

## 2016-09-06 ENCOUNTER — Ambulatory Visit (HOSPITAL_COMMUNITY): Payer: Medicare Other | Attending: Cardiology

## 2016-09-06 ENCOUNTER — Ambulatory Visit (INDEPENDENT_AMBULATORY_CARE_PROVIDER_SITE_OTHER): Payer: Medicare Other

## 2016-09-06 DIAGNOSIS — R002 Palpitations: Secondary | ICD-10-CM

## 2016-09-06 DIAGNOSIS — R9431 Abnormal electrocardiogram [ECG] [EKG]: Secondary | ICD-10-CM

## 2016-09-06 DIAGNOSIS — I340 Nonrheumatic mitral (valve) insufficiency: Secondary | ICD-10-CM | POA: Insufficient documentation

## 2016-09-06 DIAGNOSIS — I517 Cardiomegaly: Secondary | ICD-10-CM | POA: Insufficient documentation

## 2016-09-06 DIAGNOSIS — Z8249 Family history of ischemic heart disease and other diseases of the circulatory system: Secondary | ICD-10-CM | POA: Diagnosis not present

## 2016-09-06 DIAGNOSIS — I071 Rheumatic tricuspid insufficiency: Secondary | ICD-10-CM | POA: Diagnosis not present

## 2016-09-15 ENCOUNTER — Telehealth: Payer: Self-pay | Admitting: *Deleted

## 2016-09-15 ENCOUNTER — Encounter: Payer: Self-pay | Admitting: *Deleted

## 2016-09-15 NOTE — Telephone Encounter (Signed)
-----   Message from Jerline Pain, MD sent at 09/15/2016  3:37 PM EST -----  Normal sinus rhythm, 1 PVC, 1 PAC.  No atrial fibrillation  No pauses   Reassuring monitor [name], MD

## 2016-09-15 NOTE — Telephone Encounter (Signed)
Patient informed. 

## 2016-09-23 ENCOUNTER — Encounter: Payer: Self-pay | Admitting: Cardiology

## 2016-09-23 ENCOUNTER — Ambulatory Visit (INDEPENDENT_AMBULATORY_CARE_PROVIDER_SITE_OTHER): Payer: Medicare Other | Admitting: Cardiology

## 2016-09-23 ENCOUNTER — Encounter (INDEPENDENT_AMBULATORY_CARE_PROVIDER_SITE_OTHER): Payer: Self-pay

## 2016-09-23 VITALS — BP 136/72 | HR 76 | Ht 63.0 in | Wt 154.1 lb

## 2016-09-23 DIAGNOSIS — R002 Palpitations: Secondary | ICD-10-CM | POA: Diagnosis not present

## 2016-09-23 DIAGNOSIS — I493 Ventricular premature depolarization: Secondary | ICD-10-CM | POA: Diagnosis not present

## 2016-09-23 DIAGNOSIS — I10 Essential (primary) hypertension: Secondary | ICD-10-CM | POA: Diagnosis not present

## 2016-09-23 NOTE — Patient Instructions (Signed)

## 2016-09-23 NOTE — Progress Notes (Signed)
Cardiology Office Note    Date:  09/23/2016   ID:  Brenda Hunter, DOB 11-12-1934, MRN [id number]  PCP:  Brenda Pac, MD  Cardiologist:   [name], MD     History of Present Illness:  Brenda Hunter is a 81 y.o. female here for follow up of palpitations, possible LVH on EKG at the request of Dr. [name].  On 07/02/16 she visited Dr. [name] with complaints of heart fluttering, palpitations over the last month felt like a "fluttering "like sensation for many seconds. She's had "skipped beat" at times happening a few times per hour. She did not describe any chest pain, no syncope. Perhaps she had more chocolate containing caffeine but no Sudafed or other decongestant use.  Over 10 years ago she had complaints of palpitations and wore a heart monitor and was told that she had "extra beats , 2007"and was treated with atenolol. Stopped it. Went away.   Now feels nervous with it, mild nausea. Also has duodenal problem.   An EKG was performed at Dr. Eddie Dibbles office with a rhythm strip and showed unifocal PVCs. LVH was noted as well.  She has never been a smoker. No diabetes. No early family history of coronary artery disease. Her LDL cholesterol is 110, HDL 88, excellent. Hemoglobin 14, potassium 4.5, creatinine 0.89.  I take care of her husband.   09/23/16-we went over results of testing which was reassuring. She feels much better since being on them a Toprol all. We will continue. Blood pressure is slightly elevated again. I encouraged her to continue to take the Toprol for this reason as well. She is not having any side effects with the medication.   Past Medical History:  Diagnosis Date  . GERD (gastroesophageal reflux disease)   . Hematuria    Bladder Spasms (Dr. [name])  . Hiatal hernia   . History of rectal polyps   . Peripheral edema     Past Surgical History:  Procedure Laterality Date  . CYSTECTOMY     left breats benign  . HAMMER TOE SURGERY     right  foot 2016  . KNEE ARTHROSCOPY     right knee  . RECTAL POLYPECTOMY     rectal polyps  . ROTATOR CUFF REPAIR     right shoulder  . TOTAL ABDOMINAL HYSTERECTOMY     2001    Current Medications: Outpatient Medications Prior to Visit  Medication Sig Dispense Refill  . metoprolol succinate (TOPROL XL) 25 MG 24 hr tablet Take 1 tablet (25 mg total) by mouth daily. 90 tablet 3  . omeprazole (PRILOSEC) 20 MG capsule Take 20 mg by mouth daily as needed (acid reflux).    . OXYBUTYNIN CHLORIDE PO Take 10 mg by mouth as needed (over active bladder).    . trimethoprim (TRIMPEX) 100 MG tablet Take 100 mg by mouth as needed (infection).     No facility-administered medications prior to visit.      Allergies:   Macrodantin [nitrofurantoin macrocrystal] and Percocet [oxycodone-acetaminophen]   Social History   Social History  . Marital status: Married    Spouse name: Brenda Hunter  . Number of children: 2  . Years of education: N/A   Occupational History  . retired    Social History Main Topics  . Smoking status: Never Smoker  . Smokeless tobacco: Never Used  . Alcohol use No  . Drug use: No  . Sexual activity: Not Asked   Other Topics Concern  .  None   Social History Narrative  . None     Family History:  The patient's family history includes Diverticulitis in her sister; Heart Problems in her father; Heart attack in her mother; Heart disease in her mother; Kidney failure in her sister; Lung cancer in her sister; Prostate cancer in her father; Renal Disease in her sister.   ROS:   Please see the history of present illness.    ROS All other systems reviewed and are negative.   PHYSICAL EXAM:   VS:  BP 136/72   Pulse 76   Ht 5\' 3"  (1.6 m)   Wt 154 lb 1.9 oz (69.9 kg)   SpO2 97%   BMI 27.30 kg/m    GEN: Well nourished, well developed, in no acute distress  HEENT: normal  Neck: no JVD, carotid bruits, or masses Cardiac: RRR; no murmurs, rubs, or gallops,no edema    Respiratory:  clear to auscultation bilaterally, normal work of breathing GI: soft, nontender, nondistended, + BS MS: no deformity or atrophy  Skin: warm and dry, no rash Neuro:  Alert and Oriented x 3, Strength and sensation are intact Psych: euthymic mood, full affect  Wt Readings from Last 3 Encounters:  09/23/16 154 lb 1.9 oz (69.9 kg)  08/18/16 151 lb 6.4 oz (68.7 kg)      Studies/Labs Reviewed:   EKG:  EKG is ordered today.  The ekg ordered today demonstrates 08/18/16-sinus rhythm heart rate 90 with PVC, upright in 1, 2, 3. Left anterior fascicular block, poor R-wave progression, left ventricular hypertrophy. Personally viewed-prior described as above.  Recent Labs: No results found for requested labs within last 8760 hours.   Lipid Panel No results found for: CHOL, TRIG, HDL, CHOLHDL, VLDL, LDLCALC, LDLDIRECT  Additional studies/ records that were reviewed today include:  Prior office notes, lab work reviewed.  ECHO 09/06/16: - Left ventricle: The cavity size was normal. Systolic function was   normal. The estimated ejection fraction was in the range of 60%   to 65%. Wall motion was normal; there were no regional wall   motion abnormalities. Doppler parameters are consistent with   abnormal left ventricular relaxation (grade 1 diastolic   dysfunction). GLS: -23.1% - Mitral valve: There was trivial regurgitation. - Right ventricle: The cavity size was mildly dilated. Wall   thickness was normal. - Right atrium: The atrium was mildly dilated. - Tricuspid valve: There was mild regurgitation.  Holter 24hr 09/06/16:  Normal sinus rhythm, 1 PVC, 1 Hunter.  No atrial fibrillation  No pauses  Reassuring monitor   ASSESSMENT:    No diagnosis found.   PLAN:  In order of problems listed above:  PVCs/palpitations  - Reassuring monitor  - Avoid decongestants, Sudafed, stimulants  - Continue Toprol-XL 25 mg once a day. She feels much better with this. No further  palpitations.   Abnormal EKG/LVH  -Echocardiogram reassuring, no evidence of LVH.  Essential hypertension   - past few blood pressures were elevated.  -  encouraged her to continue with her Toprol which can help not only with blood pressure but also with palpitations.    Medication Adjustments/Labs and Tests Ordered: Current medicines are reviewed at length with the patient today.  Concerns regarding medicines are outlined above.  Medication changes, Labs and Tests ordered today are listed in the Patient Instructions below. There are no Patient Instructions on file for this visit.   Signed, [name], MD  09/23/2016 10:54 AM    [facility]  [address], [place], [place]  [address] Phone: [phone]; Fax: [phone]

## 2016-09-30 DIAGNOSIS — H40013 Open angle with borderline findings, low risk, bilateral: Secondary | ICD-10-CM | POA: Diagnosis not present

## 2016-09-30 DIAGNOSIS — H04123 Dry eye syndrome of bilateral lacrimal glands: Secondary | ICD-10-CM | POA: Diagnosis not present

## 2016-09-30 DIAGNOSIS — H1013 Acute atopic conjunctivitis, bilateral: Secondary | ICD-10-CM | POA: Diagnosis not present

## 2016-12-27 DIAGNOSIS — H6692 Otitis media, unspecified, left ear: Secondary | ICD-10-CM | POA: Diagnosis not present

## 2016-12-27 DIAGNOSIS — H6062 Unspecified chronic otitis externa, left ear: Secondary | ICD-10-CM | POA: Diagnosis not present

## 2016-12-27 DIAGNOSIS — J0100 Acute maxillary sinusitis, unspecified: Secondary | ICD-10-CM | POA: Diagnosis not present

## 2017-01-06 DIAGNOSIS — Z85828 Personal history of other malignant neoplasm of skin: Secondary | ICD-10-CM | POA: Diagnosis not present

## 2017-01-06 DIAGNOSIS — L570 Actinic keratosis: Secondary | ICD-10-CM | POA: Diagnosis not present

## 2017-01-06 DIAGNOSIS — L820 Inflamed seborrheic keratosis: Secondary | ICD-10-CM | POA: Diagnosis not present

## 2017-01-06 DIAGNOSIS — Z08 Encounter for follow-up examination after completed treatment for malignant neoplasm: Secondary | ICD-10-CM | POA: Diagnosis not present

## 2017-01-12 DIAGNOSIS — H9202 Otalgia, left ear: Secondary | ICD-10-CM | POA: Diagnosis not present

## 2017-02-08 DIAGNOSIS — H906 Mixed conductive and sensorineural hearing loss, bilateral: Secondary | ICD-10-CM | POA: Insufficient documentation

## 2017-02-08 DIAGNOSIS — H90A21 Sensorineural hearing loss, unilateral, right ear, with restricted hearing on the contralateral side: Secondary | ICD-10-CM | POA: Diagnosis not present

## 2017-02-08 DIAGNOSIS — H90A32 Mixed conductive and sensorineural hearing loss, unilateral, left ear with restricted hearing on the contralateral side: Secondary | ICD-10-CM | POA: Diagnosis not present

## 2017-02-08 DIAGNOSIS — H6993 Unspecified Eustachian tube disorder, bilateral: Secondary | ICD-10-CM | POA: Insufficient documentation

## 2017-02-08 DIAGNOSIS — H6983 Other specified disorders of Eustachian tube, bilateral: Secondary | ICD-10-CM | POA: Diagnosis not present

## 2017-03-15 ENCOUNTER — Emergency Department (HOSPITAL_COMMUNITY)
Admission: EM | Admit: 2017-03-15 | Discharge: 2017-03-15 | Disposition: A | Discharge status: Home / Self Care | Payer: Medicare Other | Attending: Dermatology | Admitting: Dermatology

## 2017-03-15 ENCOUNTER — Encounter (HOSPITAL_COMMUNITY): Payer: Self-pay

## 2017-03-15 ENCOUNTER — Emergency Department (HOSPITAL_COMMUNITY): Payer: Medicare Other

## 2017-03-15 DIAGNOSIS — T189XXA Foreign body of alimentary tract, part unspecified, initial encounter: Secondary | ICD-10-CM | POA: Insufficient documentation

## 2017-03-15 DIAGNOSIS — Z5321 Procedure and treatment not carried out due to patient leaving prior to being seen by health care provider: Secondary | ICD-10-CM | POA: Diagnosis not present

## 2017-03-15 DIAGNOSIS — Y929 Unspecified place or not applicable: Secondary | ICD-10-CM | POA: Insufficient documentation

## 2017-03-15 DIAGNOSIS — R079 Chest pain, unspecified: Secondary | ICD-10-CM | POA: Diagnosis not present

## 2017-03-15 DIAGNOSIS — R918 Other nonspecific abnormal finding of lung field: Secondary | ICD-10-CM | POA: Diagnosis not present

## 2017-03-15 DIAGNOSIS — Y999 Unspecified external cause status: Secondary | ICD-10-CM | POA: Diagnosis not present

## 2017-03-15 DIAGNOSIS — X58XXXA Exposure to other specified factors, initial encounter: Secondary | ICD-10-CM | POA: Diagnosis not present

## 2017-03-15 DIAGNOSIS — Y939 Activity, unspecified: Secondary | ICD-10-CM | POA: Diagnosis not present

## 2017-03-15 DIAGNOSIS — R1310 Dysphagia, unspecified: Secondary | ICD-10-CM | POA: Diagnosis not present

## 2017-03-15 NOTE — ED Notes (Signed)
Family member came to desk reporting that he was taking his wife home.  RN encouraged pt to return should her symptoms persist or get worse.

## 2017-03-15 NOTE — ED Triage Notes (Signed)
Pt reports she has food stuck in her esophagus after eating a beef brisket today for lunch "it has happened before every few months but usually I throw up and it comes out." She went to [facility] but was sent here to rule out obstructions due to esophageal stricture. She is unable to tolerate fluids, threw up water at [facility] physicians. Pt talking in clear complete sentences.

## 2017-03-16 ENCOUNTER — Other Ambulatory Visit: Payer: Self-pay | Admitting: Gastroenterology

## 2017-03-16 DIAGNOSIS — K219 Gastro-esophageal reflux disease without esophagitis: Secondary | ICD-10-CM | POA: Diagnosis not present

## 2017-03-16 DIAGNOSIS — R1310 Dysphagia, unspecified: Secondary | ICD-10-CM | POA: Diagnosis not present

## 2017-03-17 ENCOUNTER — Ambulatory Visit
Admission: RE | Admit: 2017-03-17 | Discharge: 2017-03-17 | Disposition: A | Discharge status: Home / Self Care | Payer: Medicare Other | Source: Ambulatory Visit | Attending: Gastroenterology | Admitting: Gastroenterology

## 2017-03-17 DIAGNOSIS — R1312 Dysphagia, oropharyngeal phase: Secondary | ICD-10-CM | POA: Diagnosis not present

## 2017-03-17 DIAGNOSIS — R1310 Dysphagia, unspecified: Secondary | ICD-10-CM

## 2017-03-28 ENCOUNTER — Other Ambulatory Visit: Payer: Self-pay | Admitting: Gastroenterology

## 2017-04-01 DIAGNOSIS — H353132 Nonexudative age-related macular degeneration, bilateral, intermediate dry stage: Secondary | ICD-10-CM | POA: Diagnosis not present

## 2017-04-01 DIAGNOSIS — H25013 Cortical age-related cataract, bilateral: Secondary | ICD-10-CM | POA: Diagnosis not present

## 2017-04-01 DIAGNOSIS — H2513 Age-related nuclear cataract, bilateral: Secondary | ICD-10-CM | POA: Diagnosis not present

## 2017-04-01 DIAGNOSIS — H40013 Open angle with borderline findings, low risk, bilateral: Secondary | ICD-10-CM | POA: Diagnosis not present

## 2017-04-12 DIAGNOSIS — J019 Acute sinusitis, unspecified: Secondary | ICD-10-CM | POA: Diagnosis not present

## 2017-04-12 DIAGNOSIS — J302 Other seasonal allergic rhinitis: Secondary | ICD-10-CM | POA: Diagnosis not present

## 2017-04-21 ENCOUNTER — Other Ambulatory Visit: Payer: Self-pay | Admitting: Gastroenterology

## 2017-05-03 ENCOUNTER — Encounter (HOSPITAL_COMMUNITY): Payer: Self-pay | Admitting: *Deleted

## 2017-05-04 ENCOUNTER — Ambulatory Visit (HOSPITAL_COMMUNITY): Payer: Medicare Other

## 2017-05-04 ENCOUNTER — Ambulatory Visit (HOSPITAL_COMMUNITY): Payer: Medicare Other | Admitting: Certified Registered Nurse Anesthetist

## 2017-05-04 ENCOUNTER — Encounter (HOSPITAL_COMMUNITY): Payer: Self-pay | Admitting: Certified Registered Nurse Anesthetist

## 2017-05-04 ENCOUNTER — Ambulatory Visit (HOSPITAL_COMMUNITY)
Admission: RE | Admit: 2017-05-04 | Discharge: 2017-05-04 | Disposition: A | Discharge status: Home / Self Care | Payer: Medicare Other | Source: Ambulatory Visit | Attending: Gastroenterology | Admitting: Gastroenterology

## 2017-05-04 ENCOUNTER — Encounter (HOSPITAL_COMMUNITY)
Admission: RE | Disposition: A | Discharge status: Home / Self Care | Payer: Self-pay | Source: Ambulatory Visit | Attending: Gastroenterology

## 2017-05-04 DIAGNOSIS — K219 Gastro-esophageal reflux disease without esophagitis: Secondary | ICD-10-CM | POA: Diagnosis not present

## 2017-05-04 DIAGNOSIS — Z79899 Other long term (current) drug therapy: Secondary | ICD-10-CM | POA: Insufficient documentation

## 2017-05-04 DIAGNOSIS — Z885 Allergy status to narcotic agent status: Secondary | ICD-10-CM | POA: Diagnosis not present

## 2017-05-04 DIAGNOSIS — K222 Esophageal obstruction: Secondary | ICD-10-CM | POA: Diagnosis not present

## 2017-05-04 DIAGNOSIS — K449 Diaphragmatic hernia without obstruction or gangrene: Secondary | ICD-10-CM | POA: Insufficient documentation

## 2017-05-04 DIAGNOSIS — K228 Other specified diseases of esophagus: Secondary | ICD-10-CM | POA: Diagnosis not present

## 2017-05-04 DIAGNOSIS — R1310 Dysphagia, unspecified: Secondary | ICD-10-CM | POA: Diagnosis not present

## 2017-05-04 HISTORY — DX: Cardiac arrhythmia, unspecified: I49.9

## 2017-05-04 HISTORY — DX: Unspecified hearing loss, unspecified ear: H91.90

## 2017-05-04 HISTORY — DX: Adverse effect of unspecified anesthetic, initial encounter: T41.45XA

## 2017-05-04 HISTORY — DX: Other complications of anesthesia, initial encounter: T88.59XA

## 2017-05-04 HISTORY — PX: SAVORY DILATION: SHX5439

## 2017-05-04 HISTORY — DX: Unspecified osteoarthritis, unspecified site: M19.90

## 2017-05-04 HISTORY — PX: ESOPHAGOGASTRODUODENOSCOPY (EGD) WITH PROPOFOL: SHX5813

## 2017-05-04 SURGERY — ESOPHAGOGASTRODUODENOSCOPY (EGD) WITH PROPOFOL
Anesthesia: Monitor Anesthesia Care

## 2017-05-04 MED ORDER — LACTATED RINGERS IV SOLN
INTRAVENOUS | Status: DC
  Administered 2017-05-04: 1000 mL via INTRAVENOUS

## 2017-05-04 MED ORDER — PROPOFOL 500 MG/50ML IV EMUL
INTRAVENOUS | Status: DC | PRN
  Administered 2017-05-04: 150 ug/kg/min via INTRAVENOUS

## 2017-05-04 MED ORDER — SODIUM CHLORIDE 0.9 % IV SOLN: INTRAVENOUS | Status: DC

## 2017-05-04 MED ORDER — PROPOFOL 500 MG/50ML IV EMUL
INTRAVENOUS | Status: DC | PRN
  Administered 2017-05-04 (×2): 30 mg via INTRAVENOUS

## 2017-05-04 MED ORDER — PROPOFOL 10 MG/ML IV BOLUS
INTRAVENOUS | Status: AC
  Filled 2017-05-04: qty 40

## 2017-05-04 SURGICAL SUPPLY — 14 items

## 2017-05-04 NOTE — Anesthesia Preprocedure Evaluation (Signed)
Anesthesia Evaluation    Airway Mallampati: II  TM Distance: >3 FB Neck ROM: Full    Dental no notable dental hx.    Pulmonary    Pulmonary exam normal breath sounds clear to auscultation       Cardiovascular Normal cardiovascular exam Rhythm:Regular Rate:Normal     Neuro/Psych    GI/Hepatic hiatal hernia, GERD  ,  Endo/Other    Renal/GU      Musculoskeletal  (+) Arthritis ,   Abdominal   Peds  Hematology   Anesthesia Other Findings   Reproductive/Obstetrics                             Anesthesia Physical Anesthesia Plan  ASA: II  Anesthesia Plan: MAC   Post-op Pain Management:    Induction: Intravenous  PONV Risk Score and Plan: 2 and Ondansetron and Propofol  Airway Management Planned: Nasal Cannula  Additional Equipment:   Intra-op Plan:   Post-operative Plan:   Informed Consent: I have reviewed the patients History and Physical, chart, labs and discussed the procedure including the risks, benefits and alternatives for the proposed anesthesia with the patient or authorized representative who has indicated his/her understanding and acceptance.   Dental advisory given  Plan Discussed with: CRNA  Anesthesia Plan Comments:         Anesthesia Quick Evaluation

## 2017-05-04 NOTE — Op Note (Signed)
[facility] Patient Name: Brenda Hunter Procedure Date: 05/04/2017 MRN: [id number] Attending MD: Nancy Fetter Dr., MD Date of Birth: 1934/12/07 CSN: [id number] Age: 82 Admit Type: Outpatient Procedure:                Upper GI endoscopy with Savary dilatation of                            esophageal strictures Indications:              Dysphagia with barium swallow showing hiatal hernia                            with a stricture just above the hiatal hernia with                            hangup of a 13 mm barium Providers:                Jeneen Rinks L. Lilliauna Van Dr., MD, Cleda Daub, RN, Cherylynn Ridges, Technician, Herbie Drape, CRNA Referring MD:              Medicines:                Monitored Anesthesia Care Complications:            No immediate complications. Estimated Blood Loss:     Estimated blood loss: none. Procedure:                Pre-Anesthesia Assessment:                           - Prior to the procedure, a History and Physical                            was performed, and patient medications and                            allergies were reviewed. The patient's tolerance of                            previous anesthesia was also reviewed. The risks                            and benefits of the procedure and the sedation                            options and risks were discussed with the patient.                            All questions were answered, and informed consent                            was obtained. Prior Anticoagulants: The patient has  taken no previous anticoagulant or antiplatelet                            agents. ASA Grade Assessment: II - A patient with                            mild systemic disease. After reviewing the risks                            and benefits, the patient was deemed in                            satisfactory condition to undergo the procedure.             After obtaining informed consent, the endoscope was                            passed under direct vision. Throughout the                            procedure, the patient's blood pressure, pulse, and                            oxygen saturations were monitored continuously. The                            Endoscope was introduced through the mouth, and                            advanced to the second part of duodenum. The upper                            GI endoscopy was accomplished without difficulty.                            The patient tolerated the procedure well. Scope In: Scope Out: Findings:      A medium-sized hiatal hernia was present.      One moderate benign-appearing, intrinsic stenosis was found at the       gastroesophageal junction. And was traversed. A guidewire was placed       under fluoroscopic guidance and the scope was withdrawn. Dilation was       performed with a Savary dilator with mild resistance at 12.8 mm, 14, 15       mm dilators. A small amount of blood on the 15 mm dilator. The patient       had some coughing but otherwise tolerated the procedure well.       Fluoroscopy was used for passage of the dilators.      The stomach was normal.      The examined duodenum was normal. Impression:               - Medium-sized hiatal hernia.                           - Benign-appearing esophageal stenosis. Dilated to  15 mm                           - Normal stomach.                           - Normal examined duodenum.                           - No specimens collected. Moderate Sedation:      MAC by anesthesia Recommendation:           - Patient has a contact number available for                            emergencies. The signs and symptoms of potential                            delayed complications were discussed with the                            patient. Return to normal activities tomorrow.                             Written discharge instructions were provided to the                            patient.                           - Clear liquid diet for 4 hours.                           - Continue present medications.                           - Repeat upper endoscopy PRN.                           - Return to endoscopist PRN. Procedure Code(s):        --- Professional ---                           [phone], Esophagogastroduodenoscopy, flexible,                            transoral; with insertion of guide wire followed by                            passage of dilator(s) through esophagus over guide                            wire Diagnosis Code(s):        --- Professional ---                           K22.2, Esophageal obstruction  R13.10, Dysphagia, unspecified                           K44.9, Diaphragmatic hernia without obstruction or                            gangrene CPT copyright 2016 [employer]. All rights reserved. The codes documented in this report are preliminary and upon coder review may  be revised to meet current compliance requirements. Nancy Fetter Dr., MD 05/04/2017 8:43:30 AM This report has been signed electronically. Number of Addenda: 0

## 2017-05-04 NOTE — H&P (Signed)
Subjective:   Patient is a 82 y.o. female presents with Dysphagia. I'm a soft program was performed feeling the fairly large hiatal hernia with mild narrowing the distal esophagus with hangup of the 13 mm tablet. Patient has primarily been having solid food dysphagia has been doing very well with chewing her food well and liquids.. Procedure including risks and benefits discussed in office.  There are no active problems to display for this patient.  Past Medical History:  Diagnosis Date  . Arthritis    KNEES  . Complication of anesthesia    MALES LOTS OF URINE AFTER ANESTHESIA  . Dysrhythmia   . GERD (gastroesophageal reflux disease)   . Hematuria    Bladder Spasms (Dr. [name])  . Hiatal hernia   . History of rectal polyps   . HOH (hard of hearing)    LEFT EAR  . Peripheral edema     Past Surgical History:  Procedure Laterality Date  . BASAL CELL REMOVED FROM LIP  09/2012  . COLONSCOPY    . CYSTECTOMY     left breats benign  . ESOPHAGOGASTRODUODENOSCOPY ENDOSCOPY    . HAMMER TOE SURGERY     right foot 2016  . KNEE ARTHROSCOPY     right knee  . RECTAL POLYPECTOMY     rectal polyps  . ROTATOR CUFF REPAIR     right shoulder  . TOTAL ABDOMINAL HYSTERECTOMY     2001    Prescriptions Prior to Admission  Medication Sig Dispense Refill Last Dose  . ibuprofen (ADVIL,MOTRIN) 200 MG tablet Take 200-400 mg by mouth daily as needed (FOR PAIN.).   Past Week at Unknown time  . metoprolol succinate (TOPROL XL) 25 MG 24 hr tablet Take 1 tablet (25 mg total) by mouth daily. 90 tablet 3 05/04/2017 at 0545  . omeprazole (PRILOSEC OTC) 20 MG tablet Take 20 mg by mouth daily before breakfast.   05/04/2017 at 0545  . oxybutynin (DITROPAN) 5 MG tablet Take 5 mg by mouth daily as needed for bladder spasms.   05/03/2017 at Unknown time  . pyridOXINE (VITAMIN B-6) 100 MG tablet Take 100 mg by mouth daily.   Past Week at Unknown time  . trimethoprim (TRIMPEX) 100 MG tablet Take 100 mg by mouth daily.     05/04/2017 at 0545  . vitamin B-12 (CYANOCOBALAMIN) 1000 MCG tablet Take 1,000 mcg by mouth daily.   Past Week at Unknown time   Allergies  Allergen Reactions  . Macrodantin [Nitrofurantoin Macrocrystal] Hives  . Percocet [Oxycodone-Acetaminophen] Palpitations    Dehydration Dehydration     Social History  Substance Use Topics  . Smoking status: Never Smoker  . Smokeless tobacco: Never Used  . Alcohol use No    Family History  Problem Relation Age of Onset  . Heart attack Mother   . Heart disease Mother   . Prostate cancer Father   . Heart Problems Father   . Kidney failure Sister   . Renal Disease Sister   . Lung cancer Sister   . Diverticulitis Sister      Objective:   No data found.  No intake/output data recorded. No intake/output data recorded.   See MD Preop evaluation      Assessment:   1. Dysphagia secondary to esophageal stricture probably peptic and origin  Plan:   Will proceed at this time with EGD and severally dilatation utilizing fluro. The benefits and risks of the dilatation have been discussed in the office and were discussed again  in the endoscopy unit.

## 2017-05-04 NOTE — Transfer of Care (Signed)
Immediate Anesthesia Transfer of Care Note  Patient: Brenda Hunter  Procedure(s) Performed: Procedure(s): ESOPHAGOGASTRODUODENOSCOPY (EGD) WITH PROPOFOL (N/A) SAVORY DILATION (N/A)  Patient Location: PACU  Anesthesia Type:MAC  Level of Consciousness: awake, alert  and oriented  Airway & Oxygen Therapy: Patient Spontanous Breathing and Patient connected to nasal cannula oxygen  Post-op Assessment: Report given to RN and Post -op Vital signs reviewed and stable  Post vital signs: Reviewed and stable  Last Vitals:  Vitals:   05/04/17 0754  BP: (!) 188/76  Resp: 18  Temp: 36.7 C    Last Pain:  Vitals:   05/04/17 0754  TempSrc: Oral         Complications: No apparent anesthesia complications

## 2017-05-04 NOTE — Anesthesia Postprocedure Evaluation (Signed)
Anesthesia Post Note  Patient: Dublin Cantero Mazo  Procedure(s) Performed: Procedure(s) (LRB): ESOPHAGOGASTRODUODENOSCOPY (EGD) WITH PROPOFOL (N/A) SAVORY DILATION (N/A)     Patient location during evaluation: PACU Anesthesia Type: MAC Level of consciousness: awake and alert Pain management: pain level controlled Vital Signs Assessment: post-procedure vital signs reviewed and stable Respiratory status: spontaneous breathing, nonlabored ventilation and respiratory function stable Cardiovascular status: stable and blood pressure returned to baseline Anesthetic complications: no    Last Vitals:  Vitals:   05/04/17 0913 05/04/17 0915  BP: (!) 151/80 (!) 151/80  Pulse: 87 83  Resp: 15 19  Temp:      Last Pain:  Vitals:   05/04/17 0848  TempSrc: Oral                 Lynda Rainwater

## 2017-05-06 ENCOUNTER — Encounter (HOSPITAL_COMMUNITY): Payer: Self-pay | Admitting: Gastroenterology

## 2017-05-06 DIAGNOSIS — N390 Urinary tract infection, site not specified: Secondary | ICD-10-CM | POA: Diagnosis not present

## 2017-05-06 DIAGNOSIS — R300 Dysuria: Secondary | ICD-10-CM | POA: Diagnosis not present

## 2017-05-12 DIAGNOSIS — N3001 Acute cystitis with hematuria: Secondary | ICD-10-CM | POA: Diagnosis not present

## 2017-05-12 DIAGNOSIS — Z6828 Body mass index (BMI) 28.0-28.9, adult: Secondary | ICD-10-CM | POA: Diagnosis not present

## 2017-05-12 DIAGNOSIS — E663 Overweight: Secondary | ICD-10-CM | POA: Diagnosis not present

## 2017-05-17 DIAGNOSIS — N390 Urinary tract infection, site not specified: Secondary | ICD-10-CM | POA: Diagnosis not present

## 2017-08-15 DIAGNOSIS — Z23 Encounter for immunization: Secondary | ICD-10-CM | POA: Diagnosis not present

## 2017-08-23 DIAGNOSIS — N3281 Overactive bladder: Secondary | ICD-10-CM | POA: Diagnosis not present

## 2017-08-23 DIAGNOSIS — R3129 Other microscopic hematuria: Secondary | ICD-10-CM | POA: Diagnosis not present

## 2017-08-23 DIAGNOSIS — N390 Urinary tract infection, site not specified: Secondary | ICD-10-CM | POA: Diagnosis not present

## 2017-09-21 ENCOUNTER — Other Ambulatory Visit: Payer: Self-pay | Admitting: Cardiology

## 2017-09-22 ENCOUNTER — Other Ambulatory Visit: Payer: Self-pay | Admitting: Cardiology

## 2017-09-22 MED ORDER — METOPROLOL SUCCINATE ER 25 MG PO TB24: 25.0000 mg | ORAL_TABLET | Freq: Every day | ORAL | 0 refills | Status: DC

## 2017-09-22 NOTE — Telephone Encounter (Signed)
. °*  STAT* If patient is at the pharmacy, call can be transferred to refill team.   1. Which medications need to be refilled? (please list name of each medication and dose if known) Metoprolol-pt has an appt in January  2. Which pharmacy/location (including street and city if local pharmacy) is medication to be sent to?[place] [phone]  3. Do they need a 30 day or 90 day supply? 60 until her office visit

## 2017-09-22 NOTE — Telephone Encounter (Signed)
Pt's medication was resent to pt's pharmacy as requested. Confirmation received.  °

## 2017-11-17 ENCOUNTER — Encounter: Payer: Self-pay | Admitting: Cardiology

## 2017-11-17 ENCOUNTER — Ambulatory Visit (INDEPENDENT_AMBULATORY_CARE_PROVIDER_SITE_OTHER): Payer: Medicare Other | Admitting: Cardiology

## 2017-11-17 ENCOUNTER — Encounter (INDEPENDENT_AMBULATORY_CARE_PROVIDER_SITE_OTHER): Payer: Self-pay

## 2017-11-17 VITALS — BP 138/88 | HR 75 | Ht 63.0 in | Wt 156.0 lb

## 2017-11-17 DIAGNOSIS — R002 Palpitations: Secondary | ICD-10-CM | POA: Diagnosis not present

## 2017-11-17 MED ORDER — METOPROLOL SUCCINATE ER 25 MG PO TB24: 25.0000 mg | ORAL_TABLET | Freq: Every day | ORAL | 3 refills | Status: DC

## 2017-11-17 NOTE — Patient Instructions (Signed)

## 2017-11-17 NOTE — Progress Notes (Signed)
Cardiology Office Note    Date:  11/17/2017   ID:  [name], DOB 1935-05-06, MRN [id number]  PCP:  [name], MD  Cardiologist:   [name], MD     History of Present Illness:  Brenda Hunter is a 83 y.o. female here for follow up of palpitations, possible LVH on EKG at the request of Dr. [name].  On 07/02/16 she visited Dr. [name] with complaints of heart fluttering, palpitations over the last month felt like a "fluttering "like sensation for many seconds. She's had "skipped beat" at times happening a few times per hour. She did not describe any chest pain, no syncope. Perhaps she had more chocolate containing caffeine but no Sudafed or other decongestant use.  Over 10 years ago she had complaints of palpitations and wore a heart monitor and was told that she had "extra beats , 2007"and was treated with atenolol. Stopped it. Went away.   Now feels nervous with it, mild nausea. Also has duodenal problem.   An EKG was performed at Dr. Eddie Dibbles office with a rhythm strip and showed unifocal PVCs. LVH was noted as well.  She has never been a smoker. No diabetes. No early family history of coronary artery disease. Her LDL cholesterol is 110, HDL 88, excellent. Hemoglobin 14, potassium 4.5, creatinine 0.89.  I take care of her husband.   09/23/16-we went over results of testing which was reassuring. She feels much better since being on them a Toprol all. We will continue. Blood pressure is slightly elevated again. I encouraged her to continue to take the Toprol for this reason as well. She is not having any side effects with the medication.   Past Medical History:  Diagnosis Date  . Arthritis    KNEES  . Complication of anesthesia    MALES LOTS OF URINE AFTER ANESTHESIA  . Dysrhythmia   . GERD (gastroesophageal reflux disease)   . Hematuria    Bladder Spasms (Dr. [name])  . Hiatal hernia   . History of rectal polyps   . HOH (hard of hearing)    LEFT EAR  .  Peripheral edema     Past Surgical History:  Procedure Laterality Date  . BASAL CELL REMOVED FROM LIP  09/2012  . COLONSCOPY    . CYSTECTOMY     left breats benign  . ESOPHAGOGASTRODUODENOSCOPY (EGD) WITH PROPOFOL N/A 05/04/2017   Procedure: ESOPHAGOGASTRODUODENOSCOPY (EGD) WITH PROPOFOL;  Surgeon: [name], MD;  Location: [facility];  Service: Endoscopy;  Laterality: N/A;  . ESOPHAGOGASTRODUODENOSCOPY ENDOSCOPY    . HAMMER TOE SURGERY     right foot 2016  . KNEE ARTHROSCOPY     right knee  . RECTAL POLYPECTOMY     rectal polyps  . ROTATOR CUFF REPAIR     right shoulder  . SAVORY DILATION N/A 05/04/2017   Procedure: SAVORY DILATION;  Surgeon: [name], MD;  Location: [facility];  Service: Endoscopy;  Laterality: N/A;  . TOTAL ABDOMINAL HYSTERECTOMY     2001    Current Medications: Outpatient Medications Prior to Visit  Medication Sig Dispense Refill  . ibuprofen (ADVIL,MOTRIN) 200 MG tablet Take 200-400 mg by mouth daily as needed (FOR PAIN.).    [name] methenamine (HIPREX) 1 g tablet Take 1 g by mouth 2 (two) times daily.    . metoprolol succinate (TOPROL-XL) 25 MG 24 hr tablet Take 1 tablet (25 mg total) by mouth daily. Please keep upcoming appt for future refills. Thank you 90  tablet 0  . omeprazole (PRILOSEC OTC) 20 MG tablet Take 20 mg by mouth daily before breakfast.    . oxybutynin (DITROPAN) 5 MG tablet Take 5 mg by mouth daily as needed for bladder spasms.    [name] pyridOXINE (VITAMIN B-6) 100 MG tablet Take 100 mg by mouth daily.    [name] trimethoprim (TRIMPEX) 100 MG tablet Take 100 mg by mouth daily.     . vitamin B-12 (CYANOCOBALAMIN) 1000 MCG tablet Take 1,000 mcg by mouth daily.     No facility-administered medications prior to visit.      Allergies:   Macrodantin [nitrofurantoin macrocrystal] and Percocet [oxycodone-acetaminophen]   Social History   Socioeconomic History  . Marital status: Married    Spouse name: [name]  . Number of children: 2  .  Years of education: None  . Highest education level: None  Social Needs  . Financial resource strain: None  . Food insecurity - worry: None  . Food insecurity - inability: None  . Transportation needs - medical: None  . Transportation needs - non-medical: None  Occupational History  . Occupation: retired  Tobacco Use  . Smoking status: Never Smoker  . Smokeless tobacco: Never Used  Substance and Sexual Activity  . Alcohol use: No  . Drug use: No  . Sexual activity: None  Other Topics Concern  . None  Social History Narrative  . None     Family History:  The patient's family history includes Diverticulitis in her sister; Heart Problems in her father; Heart attack in her mother; Heart disease in her mother; Kidney failure in her sister; Lung cancer in her sister; Prostate cancer in her father; Renal Disease in her sister.   ROS:   Please see the history of present illness.    Review of Systems  All other systems reviewed and are negative.     PHYSICAL EXAM:   VS:  BP 138/88   Pulse 75   Ht 5\' 3"  (1.6 m)   Wt 156 lb (70.8 kg)   SpO2 95%   BMI 27.63 kg/m    GEN: Well nourished, well developed, in no acute distress  HEENT: normal  Neck: no JVD, carotid bruits, or masses Cardiac: RRR; no murmurs, rubs, or gallops,no edema  Respiratory:  clear to auscultation bilaterally, normal work of breathing GI: soft, nontender, nondistended, + BS MS: no deformity or atrophy  Skin: warm and dry, no rash Neuro:  Alert and Oriented x 3, Strength and sensation are intact Psych: euthymic mood, full affect  Wt Readings from Last 3 Encounters:  11/17/17 156 lb (70.8 kg)  05/04/17 154 lb (69.9 kg)  09/23/16 154 lb 1.9 oz (69.9 kg)      Studies/Labs Reviewed:   EKG:  EKG is ordered today.  The ekg ordered today demonstrates 11/17/17 - NSR, LAFB. Prior - 08/18/16-sinus rhythm heart rate 90 with PVC, upright in 1, 2, 3. Left anterior fascicular block, poor R-wave progression, left  ventricular hypertrophy. Personally viewed-prior described as above.  Recent Labs: No results found for requested labs within last 8760 hours.   Lipid Panel No results found for: CHOL, TRIG, HDL, CHOLHDL, VLDL, LDLCALC, LDLDIRECT  Additional studies/ records that were reviewed today include:  Prior office notes, lab work reviewed.  ECHO 09/06/16: - Left ventricle: The cavity size was normal. Systolic function was   normal. The estimated ejection fraction was in the range of 60%   to 65%. Wall motion was normal; there were no regional wall  motion abnormalities. Doppler parameters are consistent with   abnormal left ventricular relaxation (grade 1 diastolic   dysfunction). GLS: -23.1% - Mitral valve: There was trivial regurgitation. - Right ventricle: The cavity size was mildly dilated. Wall   thickness was normal. - Right atrium: The atrium was mildly dilated. - Tricuspid valve: There was mild regurgitation.  Holter 24hr 09/06/16:  Normal sinus rhythm, 1 PVC, 1 PAC.  No atrial fibrillation  No pauses  Reassuring monitor   ASSESSMENT:    No diagnosis found.   PLAN:  In order of problems listed above:  PVCs/palpitations  - Reassuring monitor  - Avoid decongestants, Sudafed, stimulants  - Continue Toprol-XL 25 mg once a day. She feels much better with this. No further palpitations.  - Doing well   Abnormal EKG/LVH  -Echocardiogram reassuring, no evidence of LVH. No changes  Essential hypertension   - well controlled  - encouraged her to continue with her Toprol which can help not only with blood pressure but also with palpitations.    Medication Adjustments/Labs and Tests Ordered: Current medicines are reviewed at length with the patient today.  Concerns regarding medicines are outlined above.  Medication changes, Labs and Tests ordered today are listed in the Patient Instructions below. There are no Patient Instructions on file for this visit.    Signed, [name], MD  11/17/2017 3:54 PM    [address], [place], [place]  [address] Phone: [phone]; Fax: [phone]

## 2018-03-07 DIAGNOSIS — M1711 Unilateral primary osteoarthritis, right knee: Secondary | ICD-10-CM | POA: Diagnosis not present

## 2018-03-28 DIAGNOSIS — N390 Urinary tract infection, site not specified: Secondary | ICD-10-CM | POA: Diagnosis not present

## 2018-03-31 DIAGNOSIS — Z87440 Personal history of urinary (tract) infections: Secondary | ICD-10-CM | POA: Diagnosis not present

## 2018-03-31 DIAGNOSIS — R3915 Urgency of urination: Secondary | ICD-10-CM | POA: Diagnosis not present

## 2018-03-31 DIAGNOSIS — N390 Urinary tract infection, site not specified: Secondary | ICD-10-CM | POA: Diagnosis not present

## 2018-04-17 DIAGNOSIS — H353132 Nonexudative age-related macular degeneration, bilateral, intermediate dry stage: Secondary | ICD-10-CM | POA: Diagnosis not present

## 2018-04-17 DIAGNOSIS — H2513 Age-related nuclear cataract, bilateral: Secondary | ICD-10-CM | POA: Diagnosis not present

## 2018-04-17 DIAGNOSIS — H25013 Cortical age-related cataract, bilateral: Secondary | ICD-10-CM | POA: Diagnosis not present

## 2018-04-17 DIAGNOSIS — H40013 Open angle with borderline findings, low risk, bilateral: Secondary | ICD-10-CM | POA: Diagnosis not present

## 2018-07-12 DIAGNOSIS — J0100 Acute maxillary sinusitis, unspecified: Secondary | ICD-10-CM | POA: Diagnosis not present

## 2018-08-03 DIAGNOSIS — Z23 Encounter for immunization: Secondary | ICD-10-CM | POA: Diagnosis not present

## 2018-08-03 DIAGNOSIS — E785 Hyperlipidemia, unspecified: Secondary | ICD-10-CM | POA: Diagnosis not present

## 2018-08-03 DIAGNOSIS — N3281 Overactive bladder: Secondary | ICD-10-CM | POA: Diagnosis not present

## 2018-08-03 DIAGNOSIS — Z1389 Encounter for screening for other disorder: Secondary | ICD-10-CM | POA: Diagnosis not present

## 2018-08-03 DIAGNOSIS — K219 Gastro-esophageal reflux disease without esophagitis: Secondary | ICD-10-CM | POA: Diagnosis not present

## 2018-08-03 DIAGNOSIS — Z0000 Encounter for general adult medical examination without abnormal findings: Secondary | ICD-10-CM | POA: Diagnosis not present

## 2018-08-03 DIAGNOSIS — Z79899 Other long term (current) drug therapy: Secondary | ICD-10-CM | POA: Diagnosis not present

## 2018-08-24 DIAGNOSIS — R3915 Urgency of urination: Secondary | ICD-10-CM | POA: Diagnosis not present

## 2018-08-24 DIAGNOSIS — R3129 Other microscopic hematuria: Secondary | ICD-10-CM | POA: Diagnosis not present

## 2018-08-24 DIAGNOSIS — R82998 Other abnormal findings in urine: Secondary | ICD-10-CM | POA: Diagnosis not present

## 2018-08-24 DIAGNOSIS — Z87440 Personal history of urinary (tract) infections: Secondary | ICD-10-CM | POA: Diagnosis not present

## 2018-08-24 DIAGNOSIS — Z87448 Personal history of other diseases of urinary system: Secondary | ICD-10-CM | POA: Diagnosis not present

## 2018-08-24 DIAGNOSIS — R311 Benign essential microscopic hematuria: Secondary | ICD-10-CM | POA: Diagnosis not present

## 2018-08-24 DIAGNOSIS — N390 Urinary tract infection, site not specified: Secondary | ICD-10-CM | POA: Diagnosis not present

## 2018-10-03 DIAGNOSIS — H40013 Open angle with borderline findings, low risk, bilateral: Secondary | ICD-10-CM | POA: Diagnosis not present

## 2018-10-03 DIAGNOSIS — H04123 Dry eye syndrome of bilateral lacrimal glands: Secondary | ICD-10-CM | POA: Diagnosis not present

## 2018-10-03 DIAGNOSIS — H1013 Acute atopic conjunctivitis, bilateral: Secondary | ICD-10-CM | POA: Diagnosis not present

## 2018-10-04 ENCOUNTER — Other Ambulatory Visit: Payer: Self-pay | Admitting: Family Medicine

## 2018-10-04 DIAGNOSIS — Z1231 Encounter for screening mammogram for malignant neoplasm of breast: Secondary | ICD-10-CM

## 2018-10-05 DIAGNOSIS — Z87448 Personal history of other diseases of urinary system: Secondary | ICD-10-CM | POA: Diagnosis not present

## 2018-10-05 DIAGNOSIS — M48061 Spinal stenosis, lumbar region without neurogenic claudication: Secondary | ICD-10-CM | POA: Diagnosis not present

## 2018-10-05 DIAGNOSIS — M170 Bilateral primary osteoarthritis of knee: Secondary | ICD-10-CM | POA: Diagnosis not present

## 2018-10-05 DIAGNOSIS — N390 Urinary tract infection, site not specified: Secondary | ICD-10-CM | POA: Diagnosis not present

## 2018-10-05 DIAGNOSIS — R3129 Other microscopic hematuria: Secondary | ICD-10-CM | POA: Diagnosis not present

## 2018-10-05 DIAGNOSIS — Z87440 Personal history of urinary (tract) infections: Secondary | ICD-10-CM | POA: Diagnosis not present

## 2018-11-02 ENCOUNTER — Ambulatory Visit
Admission: RE | Admit: 2018-11-02 | Discharge: 2018-11-02 | Disposition: A | Discharge status: Home / Self Care | Payer: Medicare Other | Source: Ambulatory Visit | Attending: Family Medicine | Admitting: Family Medicine

## 2018-11-02 DIAGNOSIS — Z1231 Encounter for screening mammogram for malignant neoplasm of breast: Secondary | ICD-10-CM | POA: Diagnosis not present

## 2018-11-05 DIAGNOSIS — J069 Acute upper respiratory infection, unspecified: Secondary | ICD-10-CM | POA: Diagnosis not present

## 2018-11-15 ENCOUNTER — Ambulatory Visit: Payer: Medicare Other | Admitting: Cardiology

## 2018-12-20 DIAGNOSIS — K045 Chronic apical periodontitis: Secondary | ICD-10-CM | POA: Diagnosis not present

## 2018-12-23 DIAGNOSIS — R319 Hematuria, unspecified: Secondary | ICD-10-CM | POA: Diagnosis not present

## 2018-12-23 DIAGNOSIS — N390 Urinary tract infection, site not specified: Secondary | ICD-10-CM | POA: Diagnosis not present

## 2018-12-26 ENCOUNTER — Ambulatory Visit (INDEPENDENT_AMBULATORY_CARE_PROVIDER_SITE_OTHER): Payer: Medicare Other | Admitting: Cardiology

## 2018-12-26 ENCOUNTER — Encounter: Payer: Self-pay | Admitting: Cardiology

## 2018-12-26 ENCOUNTER — Encounter

## 2018-12-26 VITALS — BP 142/68 | HR 87 | Ht 63.0 in | Wt 162.8 lb

## 2018-12-26 DIAGNOSIS — I444 Left anterior fascicular block: Secondary | ICD-10-CM

## 2018-12-26 DIAGNOSIS — R002 Palpitations: Secondary | ICD-10-CM | POA: Diagnosis not present

## 2018-12-26 DIAGNOSIS — R9431 Abnormal electrocardiogram [ECG] [EKG]: Secondary | ICD-10-CM | POA: Diagnosis not present

## 2018-12-26 DIAGNOSIS — I10 Essential (primary) hypertension: Secondary | ICD-10-CM

## 2018-12-26 MED ORDER — METOPROLOL SUCCINATE ER 25 MG PO TB24: 25.0000 mg | ORAL_TABLET | Freq: Every day | ORAL | 3 refills | Status: DC

## 2018-12-26 NOTE — Patient Instructions (Signed)
Medication Instructions:  The current medical regimen is effective;  continue present plan and medications.  If you need a refill on your cardiac medications before your next appointment, please call your pharmacy.   Follow-Up: At [facility], you and your health needs are our priority.  As part of our continuing mission to provide you with exceptional heart care, we have created designated Provider Care Teams.  These Care Teams include your primary Cardiologist (physician) and Advanced Practice Providers (APPs -  Physician Assistants and Nurse Practitioners) who all work together to provide you with the care you need, when you need it. You will need a follow up appointment in 12 months.  Please call our office 2 months in advance to schedule this appointment.  You may see Mark Skains, MD or one of the following Advanced Practice Providers on your designated Care Team:   Lori Gerhardt, NP Laura Ingold, NP . Jill McDaniel, NP  Thank you for choosing [facility]!!      

## 2018-12-26 NOTE — Progress Notes (Signed)
Cardiology Office Note    Date:  12/26/2018   ID:  TY OSHIMA, DOB 18-Sep-1935, MRN [id number]  PCP:  [name], MD  Cardiologist:   [name], MD     History of Present Illness:  Brenda Hunter is a 84 y.o. female here for follow up of palpitations, possible LVH on EKG at the request of Dr. [name].  On 07/02/16 she visited Dr. [name] with complaints of heart fluttering, palpitations over the last month felt like a "fluttering "like sensation for many seconds. She's had "skipped beat" at times happening a few times per hour. She did not describe any chest pain, no syncope. Perhaps she had more chocolate containing caffeine but no Sudafed or other decongestant use.  Over 10 years ago she had complaints of palpitations and wore a heart monitor and was told that she had "extra beats , 2007"and was treated with atenolol. Stopped it. Went away.   Now feels nervous with it, mild nausea. Also has duodenal problem.   An EKG was performed at Dr. Eddie Dibbles office with a rhythm strip and showed unifocal PVCs. LVH was noted as well.  She has never been a smoker. No diabetes. No early family history of coronary artery disease. Her LDL cholesterol is 110, HDL 88, excellent. Hemoglobin 14, potassium 4.5, creatinine 0.89.  I take care of her husband.   09/23/16-we went over results of testing which was reassuring. She feels much better since being on them a Toprol all. We will continue. Blood pressure is slightly elevated again. I encouraged her to continue to take the Toprol for this reason as well. She is not having any side effects with the medication.  12/26/2018- here for the follow-up of palpitations heart fluttering.  Prior ECGs have shown unifocal PVCs.  No diabetes. Tooth surgery recently. Nervous palpitations. Wants to loose some weight.    Past Medical History:  Diagnosis Date  . Arthritis    KNEES  . Complication of anesthesia    MALES LOTS OF URINE AFTER ANESTHESIA  .  Dysrhythmia   . GERD (gastroesophageal reflux disease)   . Hematuria    Bladder Spasms (Dr. [name])  . Hiatal hernia   . History of rectal polyps   . HOH (hard of hearing)    LEFT EAR  . Peripheral edema     Past Surgical History:  Procedure Laterality Date  . BASAL CELL REMOVED FROM LIP  09/2012  . COLONSCOPY    . CYSTECTOMY     left breats benign  . ESOPHAGOGASTRODUODENOSCOPY (EGD) WITH PROPOFOL N/A 05/04/2017   Procedure: ESOPHAGOGASTRODUODENOSCOPY (EGD) WITH PROPOFOL;  Surgeon: [name], MD;  Location: [facility];  Service: Endoscopy;  Laterality: N/A;  . ESOPHAGOGASTRODUODENOSCOPY ENDOSCOPY    . HAMMER TOE SURGERY     right foot 2016  . KNEE ARTHROSCOPY     right knee  . RECTAL POLYPECTOMY     rectal polyps  . ROTATOR CUFF REPAIR     right shoulder  . SAVORY DILATION N/A 05/04/2017   Procedure: SAVORY DILATION;  Surgeon: [name], MD;  Location: [facility];  Service: Endoscopy;  Laterality: N/A;  . TOTAL ABDOMINAL HYSTERECTOMY     2001    Current Medications: Outpatient Medications Prior to Visit  Medication Sig Dispense Refill  . diclofenac sodium (VOLTAREN) 1 % GEL Apply 2 application topically 2 (two) times daily.    [name] ibuprofen (ADVIL,MOTRIN) 200 MG tablet Take 200-400 mg by mouth daily as needed (FOR PAIN.).    [name]  methenamine (HIPREX) 1 g tablet Take 1 g by mouth 2 (two) times daily.    [name] omeprazole (PRILOSEC OTC) 20 MG tablet Take 20 mg by mouth daily before breakfast.    . oxybutynin (DITROPAN) 5 MG tablet Take 5 mg by mouth daily as needed for bladder spasms.    [name] pyridOXINE (VITAMIN B-6) 100 MG tablet Take 100 mg by mouth daily.    [name] sulfamethoxazole-trimethoprim (BACTRIM DS,SEPTRA DS) 800-160 MG tablet Take 1 tablet by mouth 2 (two) times daily.    [name] trimethoprim (TRIMPEX) 100 MG tablet Take 100 mg by mouth daily.     . vitamin B-12 (CYANOCOBALAMIN) 1000 MCG tablet Take 1,000 mcg by mouth daily.    . metoprolol succinate (TOPROL-XL) 25 MG 24 hr  tablet Take 1 tablet (25 mg total) by mouth daily. 90 tablet 3   No facility-administered medications prior to visit.      Allergies:   Macrodantin [nitrofurantoin macrocrystal] and Percocet [oxycodone-acetaminophen]   Social History   Socioeconomic History  . Marital status: Married    Spouse name: [name]  . Number of children: 2  . Years of education: Not on file  . Highest education level: Not on file  Occupational History  . Occupation: retired  Scientific laboratory technician  . Financial resource strain: Not on file  . Food insecurity:    Worry: Not on file    Inability: Not on file  . Transportation needs:    Medical: Not on file    Non-medical: Not on file  Tobacco Use  . Smoking status: Never Smoker  . Smokeless tobacco: Never Used  Substance and Sexual Activity  . Alcohol use: No  . Drug use: No  . Sexual activity: Not on file  Lifestyle  . Physical activity:    Days per week: Not on file    Minutes per session: Not on file  . Stress: Not on file  Relationships  . Social connections:    Talks on phone: Not on file    Gets together: Not on file    Attends religious service: Not on file    Active member of club or organization: Not on file    Attends meetings of clubs or organizations: Not on file    Relationship status: Not on file  Other Topics Concern  . Not on file  Social History Narrative  . Not on file     Family History:  The patient's family history includes Diverticulitis in her sister; Heart Problems in her father; Heart attack in her mother; Heart disease in her mother; Kidney failure in her sister; Lung cancer in her sister; Prostate cancer in her father; Renal Disease in her sister.   ROS:   Please see the history of present illness.    Review of Systems  All other systems reviewed and are negative.     PHYSICAL EXAM:   VS:  BP (!) 142/68   Pulse 87   Ht 5\' 3"  (1.6 m)   Wt 162 lb 12.8 oz (73.8 kg)   BMI 28.84 kg/m    GEN: Well nourished, well  developed, in no acute distress  HEENT: normal  Neck: no JVD, carotid bruits, or masses Cardiac: RRR; no murmurs, rubs, or gallops,no edema  Respiratory:  clear to auscultation bilaterally, normal work of breathing GI: soft, nontender, nondistended, + BS MS: no deformity or atrophy  Skin: warm and dry, no rash Neuro:  Alert and Oriented x 3, Strength and sensation are intact Psych:  euthymic mood, full affect  Wt Readings from Last 3 Encounters:  12/26/18 162 lb 12.8 oz (73.8 kg)  11/17/17 156 lb (70.8 kg)  05/04/17 154 lb (69.9 kg)      Studies/Labs Reviewed:   EKG:  EKG is ordered today.  The ekg ordered today demonstrates 12/26/2018-normal sinus rhythm 87 left anterior fascicular block, LVH pattern personally reviewed and interpreted 11/17/17 - NSR, LAFB. Prior - 08/18/16-sinus rhythm heart rate 90 with PVC, upright in 1, 2, 3. Left anterior fascicular block, poor R-wave progression, left ventricular hypertrophy. Personally viewed-prior described as above.  Recent Labs: No results found for requested labs within last 8760 hours.   Lipid Panel No results found for: CHOL, TRIG, HDL, CHOLHDL, VLDL, LDLCALC, LDLDIRECT   Total cholesterol 223 HDL 87 LDL 122 triglyceride 71 hemoglobin 13.1 creatinine 0.8 ALT 22.  Additional studies/ records that were reviewed today include:  Prior office notes, lab work reviewed.  ECHO 09/06/16: - Left ventricle: The cavity size was normal. Systolic function was   normal. The estimated ejection fraction was in the range of 60%   to 65%. Wall motion was normal; there were no regional wall   motion abnormalities. Doppler parameters are consistent with   abnormal left ventricular relaxation (grade 1 diastolic   dysfunction). GLS: -23.1% - Mitral valve: There was trivial regurgitation. - Right ventricle: The cavity size was mildly dilated. Wall   thickness was normal. - Right atrium: The atrium was mildly dilated. - Tricuspid valve: There was mild  regurgitation.  Holter 24hr 09/06/16:  Normal sinus rhythm, 1 PVC, 1 PAC.  No atrial fibrillation  No pauses  Reassuring monitor   ASSESSMENT:    1. Palpitations   2. Essential hypertension   3. Abnormal ECG   4. LAFB (left anterior fascicular block)      PLAN:  In order of problems listed above:  PVCs/palpitations  - Reassuring monitor in the past, still feels some palpitations.  - Avoid decongestants, Sudafed, stimulants  - Continue Toprol-XL 25 mg once a day.  She is having a day where she is feeling more palpitations, she is at liberty to take a whole or extra half a pill.   Abnormal EKG/LVH  -Echocardiogram reassuring.on echo, no significant LVH noted.  No high risk symptoms such as syncope.  Essential hypertension   -Reasonably controlled.  Today slightly elevated at this office visit.  -8 to 10 pound weight gain.  She admits to eating more bread since October.  Decrease carbohydrates.  Her knee is limiting some exercise.  1 year f/u    Medication Adjustments/Labs and Tests Ordered: Current medicines are reviewed at length with the patient today.  Concerns regarding medicines are outlined above.  Medication changes, Labs and Tests ordered today are listed in the Patient Instructions below. Patient Instructions  Medication Instructions:  The current medical regimen is effective;  continue present plan and medications.  If you need a refill on your cardiac medications before your next appointment, please call your pharmacy.   Follow-Up: At [facility], you and your health needs are our priority.  As part of our continuing mission to provide you with exceptional heart care, we have created designated Provider Care Teams.  These Care Teams include your primary Cardiologist (physician) and Advanced Practice Providers (APPs -  Physician Assistants and Nurse Practitioners) who all work together to provide you with the care you need, when you need it. You will need  a follow up appointment in 12 months.  Please call our office 2 months in advance to schedule this appointment.  You may see [name], MD or one of the following Advanced Practice Providers on your designated Care Team:   Truitt Merle, NP Cecilie Kicks, NP . Kathyrn Drown, NP  Thank you for choosing [facility]!!        Signed, [name], MD  12/26/2018 9:04 AM    [facility] [place], [place],   [address] Phone: [phone]; Fax: [phone]

## 2019-01-04 DIAGNOSIS — R399 Unspecified symptoms and signs involving the genitourinary system: Secondary | ICD-10-CM | POA: Diagnosis not present

## 2019-01-15 DIAGNOSIS — R399 Unspecified symptoms and signs involving the genitourinary system: Secondary | ICD-10-CM | POA: Diagnosis not present

## 2019-01-24 DIAGNOSIS — M1711 Unilateral primary osteoarthritis, right knee: Secondary | ICD-10-CM | POA: Diagnosis not present

## 2019-01-30 DIAGNOSIS — M1711 Unilateral primary osteoarthritis, right knee: Secondary | ICD-10-CM | POA: Diagnosis not present

## 2019-02-07 DIAGNOSIS — M1711 Unilateral primary osteoarthritis, right knee: Secondary | ICD-10-CM | POA: Diagnosis not present

## 2019-04-26 DIAGNOSIS — H40013 Open angle with borderline findings, low risk, bilateral: Secondary | ICD-10-CM | POA: Diagnosis not present

## 2019-04-26 DIAGNOSIS — H2513 Age-related nuclear cataract, bilateral: Secondary | ICD-10-CM | POA: Diagnosis not present

## 2019-04-26 DIAGNOSIS — H43813 Vitreous degeneration, bilateral: Secondary | ICD-10-CM | POA: Diagnosis not present

## 2019-04-26 DIAGNOSIS — H353132 Nonexudative age-related macular degeneration, bilateral, intermediate dry stage: Secondary | ICD-10-CM | POA: Diagnosis not present

## 2019-05-10 DIAGNOSIS — M5416 Radiculopathy, lumbar region: Secondary | ICD-10-CM | POA: Diagnosis not present

## 2019-07-24 DIAGNOSIS — Z20828 Contact with and (suspected) exposure to other viral communicable diseases: Secondary | ICD-10-CM | POA: Diagnosis not present

## 2019-08-03 DIAGNOSIS — Z23 Encounter for immunization: Secondary | ICD-10-CM | POA: Diagnosis not present

## 2019-08-06 DIAGNOSIS — L570 Actinic keratosis: Secondary | ICD-10-CM | POA: Diagnosis not present

## 2019-08-06 DIAGNOSIS — L820 Inflamed seborrheic keratosis: Secondary | ICD-10-CM | POA: Diagnosis not present

## 2019-08-06 DIAGNOSIS — M6740 Ganglion, unspecified site: Secondary | ICD-10-CM | POA: Diagnosis not present

## 2019-08-06 DIAGNOSIS — Z08 Encounter for follow-up examination after completed treatment for malignant neoplasm: Secondary | ICD-10-CM | POA: Diagnosis not present

## 2019-08-06 DIAGNOSIS — Z85828 Personal history of other malignant neoplasm of skin: Secondary | ICD-10-CM | POA: Diagnosis not present

## 2019-11-07 DIAGNOSIS — H1045 Other chronic allergic conjunctivitis: Secondary | ICD-10-CM | POA: Diagnosis not present

## 2019-11-07 DIAGNOSIS — H40013 Open angle with borderline findings, low risk, bilateral: Secondary | ICD-10-CM | POA: Diagnosis not present

## 2019-11-07 DIAGNOSIS — H04123 Dry eye syndrome of bilateral lacrimal glands: Secondary | ICD-10-CM | POA: Diagnosis not present

## 2019-11-07 DIAGNOSIS — H04221 Epiphora due to insufficient drainage, right lacrimal gland: Secondary | ICD-10-CM | POA: Diagnosis not present

## 2019-11-15 DIAGNOSIS — Z87898 Personal history of other specified conditions: Secondary | ICD-10-CM | POA: Diagnosis not present

## 2019-11-15 DIAGNOSIS — H40009 Preglaucoma, unspecified, unspecified eye: Secondary | ICD-10-CM | POA: Diagnosis not present

## 2019-11-15 DIAGNOSIS — Z0000 Encounter for general adult medical examination without abnormal findings: Secondary | ICD-10-CM | POA: Diagnosis not present

## 2019-11-15 DIAGNOSIS — E7800 Pure hypercholesterolemia, unspecified: Secondary | ICD-10-CM | POA: Diagnosis not present

## 2019-11-15 DIAGNOSIS — M1990 Unspecified osteoarthritis, unspecified site: Secondary | ICD-10-CM | POA: Diagnosis not present

## 2019-11-15 DIAGNOSIS — Z79899 Other long term (current) drug therapy: Secondary | ICD-10-CM | POA: Diagnosis not present

## 2019-11-15 DIAGNOSIS — I10 Essential (primary) hypertension: Secondary | ICD-10-CM | POA: Diagnosis not present

## 2019-11-15 DIAGNOSIS — K219 Gastro-esophageal reflux disease without esophagitis: Secondary | ICD-10-CM | POA: Diagnosis not present

## 2019-11-15 DIAGNOSIS — N3281 Overactive bladder: Secondary | ICD-10-CM | POA: Diagnosis not present

## 2019-11-29 DIAGNOSIS — E7800 Pure hypercholesterolemia, unspecified: Secondary | ICD-10-CM | POA: Diagnosis not present

## 2019-11-29 DIAGNOSIS — Z0000 Encounter for general adult medical examination without abnormal findings: Secondary | ICD-10-CM | POA: Diagnosis not present

## 2019-11-29 DIAGNOSIS — Z87898 Personal history of other specified conditions: Secondary | ICD-10-CM | POA: Diagnosis not present

## 2019-11-29 DIAGNOSIS — I10 Essential (primary) hypertension: Secondary | ICD-10-CM | POA: Diagnosis not present

## 2019-11-29 DIAGNOSIS — K219 Gastro-esophageal reflux disease without esophagitis: Secondary | ICD-10-CM | POA: Diagnosis not present

## 2019-11-29 DIAGNOSIS — H40009 Preglaucoma, unspecified, unspecified eye: Secondary | ICD-10-CM | POA: Diagnosis not present

## 2019-11-29 DIAGNOSIS — Z79899 Other long term (current) drug therapy: Secondary | ICD-10-CM | POA: Diagnosis not present

## 2019-11-29 DIAGNOSIS — N3281 Overactive bladder: Secondary | ICD-10-CM | POA: Diagnosis not present

## 2019-11-30 DIAGNOSIS — M25531 Pain in right wrist: Secondary | ICD-10-CM | POA: Diagnosis not present

## 2020-01-11 ENCOUNTER — Other Ambulatory Visit: Payer: Self-pay

## 2020-01-11 ENCOUNTER — Encounter (INDEPENDENT_AMBULATORY_CARE_PROVIDER_SITE_OTHER): Payer: Self-pay

## 2020-01-11 ENCOUNTER — Ambulatory Visit (INDEPENDENT_AMBULATORY_CARE_PROVIDER_SITE_OTHER): Payer: Medicare Other | Admitting: Cardiology

## 2020-01-11 ENCOUNTER — Encounter: Payer: Self-pay | Admitting: Cardiology

## 2020-01-11 VITALS — BP 140/70 | HR 85 | Ht 63.0 in | Wt 160.0 lb

## 2020-01-11 DIAGNOSIS — R002 Palpitations: Secondary | ICD-10-CM | POA: Insufficient documentation

## 2020-01-11 DIAGNOSIS — I10 Essential (primary) hypertension: Secondary | ICD-10-CM | POA: Insufficient documentation

## 2020-01-11 DIAGNOSIS — I444 Left anterior fascicular block: Secondary | ICD-10-CM | POA: Insufficient documentation

## 2020-01-11 MED ORDER — METOPROLOL SUCCINATE ER 25 MG PO TB24: 25.0000 mg | ORAL_TABLET | Freq: Every day | ORAL | 3 refills | Status: DC

## 2020-01-11 NOTE — Patient Instructions (Signed)
Medication Instructions:  The current medical regimen is effective;  continue present plan and medications.  *If you need a refill on your cardiac medications before your next appointment, please call your pharmacy*  Follow-Up: At [facility], you and your health needs are our priority.  As part of our continuing mission to provide you with exceptional heart care, we have created designated Provider Care Teams.  These Care Teams include your primary Cardiologist (physician) and Advanced Practice Providers (APPs -  Physician Assistants and Nurse Practitioners) who all work together to provide you with the care you need, when you need it.  We recommend signing up for the patient portal called "MyChart".  Sign up information is provided on this After Visit Summary.  MyChart is used to connect with patients for Virtual Visits (Telemedicine).  Patients are able to view lab/test results, encounter notes, upcoming appointments, etc.  Non-urgent messages can be sent to your provider as well.   To learn more about what you can do with MyChart, go to [email or web address].    Your next appointment:   12 month(s)  The format for your next appointment:   In Person  Provider:   Mark Skains, MD   Thank you for choosing [facility]!!      

## 2020-01-11 NOTE — Progress Notes (Signed)
Cardiology Office Note:    Date:  01/11/2020   ID:  Brenda ARBELAEZ, DOB 1935/10/19, MRN [id number]  PCP:  [name], MD  Cardiologist:  [name], MD  Electrophysiologist:  None   Referring MD: [name], MD     History of Present Illness:    Brenda Hunter is a 85 y.o. female here for the follow-up of palpitations.  Back in 2017 she had complaints of heart fluttering to Dr. [name], skipped beats.  She was treated for atenolol several years ago in 2007 after she had extra beats.  Feels some nervousness with it.  She has had unifocal PVCs in the past.  Non-smoker no early family history of CAD.  LDL 110.  We utilized metoprolol.  Seem to help.  I take care of her husband, Brenda Hunter.  He is doing well-enjoying gardening.  Overall she feels well except for some mild shortness of breath with exertion.  When she gets into the grocery store for instance sometimes she feels as though she needs to take in a deep breath.  She has been limited by her knee osteoarthritis.  Meloxicam has been given and she takes occasionally.  This also has helped with her sciatica she states.  No chest pain no syncope no bleeding.     Past Medical History:  Diagnosis Date  . Arthritis    KNEES  . Complication of anesthesia    MALES LOTS OF URINE AFTER ANESTHESIA  . Dysrhythmia   . GERD (gastroesophageal reflux disease)   . Hematuria    Bladder Spasms (Dr. [name])  . Hiatal hernia   . History of rectal polyps   . HOH (hard of hearing)    LEFT EAR  . Peripheral edema     Past Surgical History:  Procedure Laterality Date  . BASAL CELL REMOVED FROM LIP  09/2012  . COLONSCOPY    . CYSTECTOMY     left breats benign  . ESOPHAGOGASTRODUODENOSCOPY (EGD) WITH PROPOFOL N/A 05/04/2017   Procedure: ESOPHAGOGASTRODUODENOSCOPY (EGD) WITH PROPOFOL;  Surgeon: [name], MD;  Location: [facility];  Service: Endoscopy;  Laterality: N/A;  . ESOPHAGOGASTRODUODENOSCOPY ENDOSCOPY    . HAMMER TOE SURGERY      right foot 2016  . KNEE ARTHROSCOPY     right knee  . RECTAL POLYPECTOMY     rectal polyps  . ROTATOR CUFF REPAIR     right shoulder  . SAVORY DILATION N/A 05/04/2017   Procedure: SAVORY DILATION;  Surgeon: [name], MD;  Location: [facility];  Service: Endoscopy;  Laterality: N/A;  . TOTAL ABDOMINAL HYSTERECTOMY     2001    Current Medications: Current Meds  Medication Sig  . diclofenac sodium (VOLTAREN) 1 % GEL Apply 2 application topically 2 (two) times daily.  [name] ibuprofen (ADVIL,MOTRIN) 200 MG tablet Take 200-400 mg by mouth daily as needed (FOR PAIN.).  [name] meloxicam (MOBIC) 15 MG tablet Take 15 mg by mouth daily as needed.  . methenamine (HIPREX) 1 g tablet Take 1 g by mouth 2 (two) times daily.  . metoprolol succinate (TOPROL-XL) 25 MG 24 hr tablet Take 1 tablet (25 mg total) by mouth daily.  [name] omeprazole (PRILOSEC OTC) 20 MG tablet Take 20 mg by mouth daily before breakfast.  . oxybutynin (DITROPAN) 5 MG tablet Take 5 mg by mouth daily as needed for bladder spasms.  [name] pyridOXINE (VITAMIN B-6) 100 MG tablet Take 100 mg by mouth daily.  [name] sulfamethoxazole-trimethoprim (BACTRIM DS,SEPTRA DS) 800-160 MG tablet  Take 1 tablet by mouth 2 (two) times daily.  [name] trimethoprim (TRIMPEX) 100 MG tablet Take 100 mg by mouth daily.   . vitamin B-12 (CYANOCOBALAMIN) 1000 MCG tablet Take 1,000 mcg by mouth daily.  . [DISCONTINUED] metoprolol succinate (TOPROL-XL) 25 MG 24 hr tablet Take 1 tablet (25 mg total) by mouth daily.     Allergies:   Macrodantin [nitrofurantoin macrocrystal] and Percocet [oxycodone-acetaminophen]   Social History   Socioeconomic History  . Marital status: Married    Spouse name: [name]  . Number of children: 2  . Years of education: Not on file  . Highest education level: Not on file  Occupational History  . Occupation: retired  Tobacco Use  . Smoking status: Never Smoker  . Smokeless tobacco: Never Used  Substance and Sexual Activity  . Alcohol  use: No  . Drug use: No  . Sexual activity: Not on file  Other Topics Concern  . Not on file  Social History Narrative  . Not on file   Social Determinants of Health   Financial Resource Strain:   . Difficulty of Paying Living Expenses:   Food Insecurity:   . Worried About Charity fundraiser in the Last Year:   . Arboriculturist in the Last Year:   Transportation Needs:   . Film/video editor (Medical):   [name] Lack of Transportation (Non-Medical):   Physical Activity:   . Days of Exercise per Week:   . Minutes of Exercise per Session:   Stress:   . Feeling of Stress :   Social Connections:   . Frequency of Communication with Friends and Family:   . Frequency of Social Gatherings with Friends and Family:   . Attends Religious Services:   . Active Member of Clubs or Organizations:   . Attends Archivist Meetings:   [name] Marital Status:      Family History: The patient's family history includes Diverticulitis in her sister; Heart Problems in her father; Heart attack in her mother; Heart disease in her mother; Kidney failure in her sister; Lung cancer in her sister; Prostate cancer in her father; Renal Disease in her sister.  ROS:   Please see the history of present illness.     All other systems reviewed and are negative.  EKGs/Labs/Other Studies Reviewed:    The following studies were reviewed today:  ECHO 09/06/16: - Left ventricle: The cavity size was normal. Systolic function was normal. The estimated ejection fraction was in the range of 60% to 65%. Wall motion was normal; there were no regional wall motion abnormalities. Doppler parameters are consistent with abnormal left ventricular relaxation (grade 1 diastolic dysfunction). GLS: -23.1% - Mitral valve: There was trivial regurgitation. - Right ventricle: The cavity size was mildly dilated. Wall thickness was normal. - Right atrium: The atrium was mildly dilated. - Tricuspid valve: There  was mild regurgitation.  Holter 24hr 09/06/16:  Normal sinus rhythm, 1 PVC, 1 PAC.  No atrial fibrillation  No pauses  Reassuring monitor  EKG:  EKG is  ordered today.  The ekg ordered today demonstrates sinus rhythm 85 left anterior fascicular block borderline LVH-  prior EKG on 12/26/2018 shows sinus rhythm 87 with left anterior fascicular block LVH pattern.  Recent Labs: No results found for requested labs within last 8760 hours.  Recent Lipid Panel No results found for: CHOL, TRIG, HDL, CHOLHDL, VLDL, LDLCALC, LDLDIRECT  Physical Exam:    VS:  BP 140/70   Pulse 85  Ht 5\' 3"  (1.6 m)   Wt 160 lb (72.6 kg)   SpO2 96%   BMI 28.34 kg/m     Wt Readings from Last 3 Encounters:  01/11/20 160 lb (72.6 kg)  12/26/18 162 lb 12.8 oz (73.8 kg)  11/17/17 156 lb (70.8 kg)     GEN:  Well nourished, well developed in no acute distress HEENT: Normal NECK: No JVD; No carotid bruits LYMPHATICS: No lymphadenopathy CARDIAC: RRR, soft systolic murmur,no  rubs, gallops RESPIRATORY:  Clear to auscultation without rales, wheezing or rhonchi  ABDOMEN: Soft, non-tender, non-distended MUSCULOSKELETAL:  No edema; No deformity  SKIN: Warm and dry NEUROLOGIC:  Alert and oriented x 3 PSYCHIATRIC:  Normal affect   ASSESSMENT:    1. Palpitations   2. Essential hypertension   3. LAFB (left anterior fascicular block)    PLAN:    In order of problems listed above:  PVCs/palpitations -Monitor once again reviewed as above.  Echo from 2017 reassuring.  On low-dose Toprol.  Okay to take an extra if she is feeling more symptoms. -Knows to avoid stimulants caffeine decongestant Sudafed  Essential hypertension -Overall well controlled.  Left anterior fascicular block -No syncope.  Overall doing well.  Mild shortness of breath with activity -Continue to monitor clinically.  She does have knee osteoarthritis.  Her mild weight gain as well as decreased activity are likely playing a role in  conditioning.  If this does worsen, she will let us know and we can always repeat her echocardiogram since it has been since 2017.   Medication Adjustments/Labs and Tests Ordered: Current medicines are reviewed at length with the patient today.  Concerns regarding medicines are outlined above.  Orders Placed This Encounter  Procedures  . EKG 12-Lead   Meds ordered this encounter  Medications  . metoprolol succinate (TOPROL-XL) 25 MG 24 hr tablet    Sig: Take 1 tablet (25 mg total) by mouth daily.    Dispense:  90 tablet    Refill:  3    Patient Instructions  Medication Instructions:  The current medical regimen is effective;  continue present plan and medications.  *If you need a refill on your cardiac medications before your next appointment, please call your pharmacy*  Follow-Up: At [facility], you and your health needs are our priority.  As part of our continuing mission to provide you with exceptional heart care, we have created designated Provider Care Teams.  These Care Teams include your primary Cardiologist (physician) and Advanced Practice Providers (APPs -  Physician Assistants and Nurse Practitioners) who all work together to provide you with the care you need, when you need it.  We recommend signing up for the patient portal called "MyChart".  Sign up information is provided on this After Visit Summary.  MyChart is used to connect with patients for Virtual Visits (Telemedicine).  Patients are able to view lab/test results, encounter notes, upcoming appointments, etc.  Non-urgent messages can be sent to your provider as well.   To learn more about what you can do with MyChart, go to [email or web address].    Your next appointment:   12 month(s)  The format for your next appointment:   In Person  Provider:   [name], MD  Thank you for choosing [facility]!!        Signed, [name], MD  01/11/2020 10:55 AM    [place]

## 2020-07-03 DIAGNOSIS — H353132 Nonexudative age-related macular degeneration, bilateral, intermediate dry stage: Secondary | ICD-10-CM | POA: Diagnosis not present

## 2020-07-03 DIAGNOSIS — H2513 Age-related nuclear cataract, bilateral: Secondary | ICD-10-CM | POA: Diagnosis not present

## 2020-07-03 DIAGNOSIS — H40013 Open angle with borderline findings, low risk, bilateral: Secondary | ICD-10-CM | POA: Diagnosis not present

## 2020-07-03 DIAGNOSIS — H25013 Cortical age-related cataract, bilateral: Secondary | ICD-10-CM | POA: Diagnosis not present

## 2020-07-16 DIAGNOSIS — L820 Inflamed seborrheic keratosis: Secondary | ICD-10-CM | POA: Diagnosis not present

## 2020-07-30 DIAGNOSIS — R197 Diarrhea, unspecified: Secondary | ICD-10-CM | POA: Diagnosis not present

## 2020-08-06 DIAGNOSIS — L821 Other seborrheic keratosis: Secondary | ICD-10-CM | POA: Diagnosis not present

## 2020-08-06 DIAGNOSIS — Z85828 Personal history of other malignant neoplasm of skin: Secondary | ICD-10-CM | POA: Diagnosis not present

## 2020-08-06 DIAGNOSIS — L918 Other hypertrophic disorders of the skin: Secondary | ICD-10-CM | POA: Diagnosis not present

## 2020-08-25 DIAGNOSIS — Z23 Encounter for immunization: Secondary | ICD-10-CM | POA: Diagnosis not present

## 2020-09-08 DIAGNOSIS — J019 Acute sinusitis, unspecified: Secondary | ICD-10-CM | POA: Diagnosis not present

## 2020-09-08 DIAGNOSIS — J22 Unspecified acute lower respiratory infection: Secondary | ICD-10-CM | POA: Diagnosis not present

## 2020-09-22 DIAGNOSIS — Z23 Encounter for immunization: Secondary | ICD-10-CM | POA: Diagnosis not present

## 2020-10-09 DIAGNOSIS — R3915 Urgency of urination: Secondary | ICD-10-CM | POA: Diagnosis not present

## 2020-10-09 DIAGNOSIS — R3129 Other microscopic hematuria: Secondary | ICD-10-CM | POA: Diagnosis not present

## 2020-10-09 DIAGNOSIS — N390 Urinary tract infection, site not specified: Secondary | ICD-10-CM | POA: Diagnosis not present

## 2020-10-22 DIAGNOSIS — R0981 Nasal congestion: Secondary | ICD-10-CM | POA: Diagnosis not present

## 2020-10-22 DIAGNOSIS — R509 Fever, unspecified: Secondary | ICD-10-CM | POA: Diagnosis not present

## 2020-10-22 DIAGNOSIS — Z1152 Encounter for screening for COVID-19: Secondary | ICD-10-CM | POA: Diagnosis not present

## 2020-11-20 DIAGNOSIS — R7301 Impaired fasting glucose: Secondary | ICD-10-CM | POA: Diagnosis not present

## 2020-11-20 DIAGNOSIS — I10 Essential (primary) hypertension: Secondary | ICD-10-CM | POA: Diagnosis not present

## 2020-11-20 DIAGNOSIS — D649 Anemia, unspecified: Secondary | ICD-10-CM | POA: Diagnosis not present

## 2020-11-20 DIAGNOSIS — K922 Gastrointestinal hemorrhage, unspecified: Secondary | ICD-10-CM | POA: Diagnosis not present

## 2020-11-25 DIAGNOSIS — K921 Melena: Secondary | ICD-10-CM | POA: Diagnosis not present

## 2020-11-25 DIAGNOSIS — Z791 Long term (current) use of non-steroidal anti-inflammatories (NSAID): Secondary | ICD-10-CM | POA: Diagnosis not present

## 2020-11-25 DIAGNOSIS — Z01812 Encounter for preprocedural laboratory examination: Secondary | ICD-10-CM | POA: Diagnosis not present

## 2020-11-25 DIAGNOSIS — D62 Acute posthemorrhagic anemia: Secondary | ICD-10-CM | POA: Diagnosis not present

## 2020-11-25 DIAGNOSIS — K219 Gastro-esophageal reflux disease without esophagitis: Secondary | ICD-10-CM | POA: Diagnosis not present

## 2020-11-28 DIAGNOSIS — K449 Diaphragmatic hernia without obstruction or gangrene: Secondary | ICD-10-CM | POA: Diagnosis not present

## 2020-11-28 DIAGNOSIS — Q394 Esophageal web: Secondary | ICD-10-CM | POA: Diagnosis not present

## 2020-11-28 DIAGNOSIS — D509 Iron deficiency anemia, unspecified: Secondary | ICD-10-CM | POA: Diagnosis not present

## 2020-11-28 DIAGNOSIS — K2930 Chronic superficial gastritis without bleeding: Secondary | ICD-10-CM | POA: Diagnosis not present

## 2020-11-28 DIAGNOSIS — K2289 Other specified disease of esophagus: Secondary | ICD-10-CM | POA: Diagnosis not present

## 2020-12-02 ENCOUNTER — Other Ambulatory Visit: Payer: Self-pay | Admitting: Cardiology

## 2020-12-03 DIAGNOSIS — K2930 Chronic superficial gastritis without bleeding: Secondary | ICD-10-CM | POA: Diagnosis not present

## 2020-12-22 DIAGNOSIS — D649 Anemia, unspecified: Secondary | ICD-10-CM | POA: Diagnosis not present

## 2021-01-20 ENCOUNTER — Other Ambulatory Visit: Payer: Self-pay

## 2021-01-20 ENCOUNTER — Encounter: Payer: Self-pay | Admitting: Cardiology

## 2021-01-20 ENCOUNTER — Ambulatory Visit (INDEPENDENT_AMBULATORY_CARE_PROVIDER_SITE_OTHER): Payer: Medicare Other | Admitting: Cardiology

## 2021-01-20 VITALS — BP 150/80 | HR 86 | Ht 63.0 in | Wt 154.0 lb

## 2021-01-20 DIAGNOSIS — I444 Left anterior fascicular block: Secondary | ICD-10-CM

## 2021-01-20 DIAGNOSIS — I10 Essential (primary) hypertension: Secondary | ICD-10-CM | POA: Diagnosis not present

## 2021-01-20 DIAGNOSIS — R002 Palpitations: Secondary | ICD-10-CM

## 2021-01-20 DIAGNOSIS — D649 Anemia, unspecified: Secondary | ICD-10-CM | POA: Diagnosis not present

## 2021-01-20 MED ORDER — METOPROLOL SUCCINATE ER 25 MG PO TB24: ORAL_TABLET | ORAL | 3 refills | Status: DC

## 2021-01-20 NOTE — Progress Notes (Signed)
Cardiology Office Note:    Date:  01/20/2021   ID:  Brenda Hunter, DOB Feb 13, 1935, MRN [id number]  PCP:  [name], MD  Cardiologist:  [name], MD  Electrophysiologist:  None   Referring MD: [name], MD     History of Present Illness:    Brenda Hunter is a 86 y.o. female here for the follow-up of palpitations.  Back in 2017 she had complaints of heart fluttering to Dr. [name], skipped beats.  She was treated for atenolol several years ago in 2007 after she had extra beats.  Feels some nervousness with it.  She has had unifocal PVCs in the past.  Non-smoker no early family history of CAD.  LDL 110.  We utilized metoprolol.  Seem to help.  I take care of her husband, [name].  He is doing well-enjoying gardening.  Overall she feels well except for some mild shortness of breath with exertion.  When she gets into the grocery store for instance sometimes she feels as though she needs to take in a deep breath.  She has been limited by her knee osteoarthritis.  Meloxicam has been given and she takes occasionally.  This also has helped with her sciatica she states.  No chest pain no syncope no bleeding.  01/20/21  - Here for follow up PVC. In 10/2020 Hg drop 12 to 8. Took too much Advil. EGD inflammation. Prilosec. Iron for 3 months. Bland diet. Back to 11 hgb.  Overall from a cardiac perspective she is doing fairly well.  Minimal palpitations.  She is taking her metoprolol.  No fevers chills nausea vomiting syncope bleeding.   Past Medical History:  Diagnosis Date  . Arthritis    KNEES  . Complication of anesthesia    MALES LOTS OF URINE AFTER ANESTHESIA  . Dysrhythmia   . GERD (gastroesophageal reflux disease)   . Hematuria    Bladder Spasms (Dr. [name])  . Hiatal hernia   . History of rectal polyps   . HOH (hard of hearing)    LEFT EAR  . Peripheral edema     Past Surgical History:  Procedure Laterality Date  . BASAL CELL REMOVED FROM LIP  09/2012  . COLONSCOPY     . CYSTECTOMY     left breats benign  . ESOPHAGOGASTRODUODENOSCOPY (EGD) WITH PROPOFOL N/A 05/04/2017   Procedure: ESOPHAGOGASTRODUODENOSCOPY (EGD) WITH PROPOFOL;  Surgeon: [name], MD;  Location: [facility];  Service: Endoscopy;  Laterality: N/A;  . ESOPHAGOGASTRODUODENOSCOPY ENDOSCOPY    . HAMMER TOE SURGERY     right foot 2016  . KNEE ARTHROSCOPY     right knee  . RECTAL POLYPECTOMY     rectal polyps  . ROTATOR CUFF REPAIR     right shoulder  . SAVORY DILATION N/A 05/04/2017   Procedure: SAVORY DILATION;  Surgeon: [name], MD;  Location: [facility];  Service: Endoscopy;  Laterality: N/A;  . TOTAL ABDOMINAL HYSTERECTOMY     2001    Current Medications: Current Meds  Medication Sig  . diclofenac sodium (VOLTAREN) 1 % GEL Apply 2 application topically 2 (two) times daily.  [name] ibuprofen (ADVIL,MOTRIN) 200 MG tablet Take 200-400 mg by mouth daily as needed (FOR PAIN.).  [name] meloxicam (MOBIC) 15 MG tablet Take 15 mg by mouth daily as needed.  . methenamine (HIPREX) 1 g tablet Take 1 g by mouth 2 (two) times daily.  . metoprolol succinate (TOPROL-XL) 25 MG 24 hr tablet TAKE ONE (1) TABLET BY MOUTH EACH  DAY  . omeprazole (PRILOSEC OTC) 20 MG tablet Take 20 mg by mouth daily before breakfast.  . oxybutynin (DITROPAN) 5 MG tablet Take 5 mg by mouth daily as needed for bladder spasms.  [name] pyridOXINE (VITAMIN B-6) 100 MG tablet Take 100 mg by mouth daily.  [name] sulfamethoxazole-trimethoprim (BACTRIM DS,SEPTRA DS) 800-160 MG tablet Take 1 tablet by mouth 2 (two) times daily.  [name] trimethoprim (TRIMPEX) 100 MG tablet Take 100 mg by mouth daily.  . vitamin B-12 (CYANOCOBALAMIN) 1000 MCG tablet Take 1,000 mcg by mouth daily.     Allergies:   Macrodantin [nitrofurantoin macrocrystal] and Percocet [oxycodone-acetaminophen]   Social History   Socioeconomic History  . Marital status: Married    Spouse name: [name]  . Number of children: 2  . Years of education: Not on file  .  Highest education level: Not on file  Occupational History  . Occupation: retired  Tobacco Use  . Smoking status: Never Smoker  . Smokeless tobacco: Never Used  Vaping Use  . Vaping Use: Never used  Substance and Sexual Activity  . Alcohol use: No  . Drug use: No  . Sexual activity: Not on file  Other Topics Concern  . Not on file  Social History Narrative  . Not on file   Social Determinants of Health   Financial Resource Strain: Not on file  Food Insecurity: Not on file  Transportation Needs: Not on file  Physical Activity: Not on file  Stress: Not on file  Social Connections: Not on file     Family History: The patient's family history includes Diverticulitis in her sister; Heart Problems in her father; Heart attack in her mother; Heart disease in her mother; Kidney failure in her sister; Lung cancer in her sister; Prostate cancer in her father; Renal Disease in her sister.  ROS:   Please see the history of present illness.     All other systems reviewed and are negative.  EKGs/Labs/Other Studies Reviewed:    The following studies were reviewed today:  ECHO 09/06/16: - Left ventricle: The cavity size was normal. Systolic function was normal. The estimated ejection fraction was in the range of 60% to 65%. Wall motion was normal; there were no regional wall motion abnormalities. Doppler parameters are consistent with abnormal left ventricular relaxation (grade 1 diastolic dysfunction). GLS: -23.1% - Mitral valve: There was trivial regurgitation. - Right ventricle: The cavity size was mildly dilated. Wall thickness was normal. - Right atrium: The atrium was mildly dilated. - Tricuspid valve: There was mild regurgitation.  Holter 24hr 09/06/16:  Normal sinus rhythm, 1 PVC, 1 PAC.  No atrial fibrillation  No pauses  Reassuring monitor  EKG:  EKG is  ordered today.  The ekg ordered today demonstrates sinus rhythm 86 left anterior fascicular  block borderline LVH, poor R wave progression-  prior EKG on 12/26/2018 shows sinus rhythm 87 with left anterior fascicular block LVH pattern.  Recent Labs: No results found for requested labs within last 8760 hours.  Recent Lipid Panel No results found for: CHOL, TRIG, HDL, CHOLHDL, VLDL, LDLCALC, LDLDIRECT  Physical Exam:    VS:  BP (!) 150/80 (BP Location: Left Arm, Patient Position: Sitting, Cuff Size: Normal)   Pulse 86   Ht 5\' 3"  (1.6 m)   Wt 154 lb (69.9 kg)   SpO2 96%   BMI 27.28 kg/m     Wt Readings from Last 3 Encounters:  01/20/21 154 lb (69.9 kg)  01/11/20 160 lb (72.6 kg)  12/26/18 162 lb 12.8 oz (73.8 kg)     GEN: Well nourished, well developed, in no acute distress  HEENT: normal  Neck: no JVD, carotid bruits, or masses Cardiac: RRR; no murmurs, rubs, or gallops,no edema  Respiratory:  clear to auscultation bilaterally, normal work of breathing GI: soft, nontender, nondistended, + BS MS: no deformity or atrophy  Skin: warm and dry, no rash Neuro:  Alert and Oriented x 3, Strength and sensation are intact Psych: euthymic mood, full affect   ASSESSMENT:    1. Palpitations   2. Essential hypertension   3. LAFB (left anterior fascicular block)    PLAN:    In order of problems listed above:  Peptic ulcer disease/gastritis -Drop in hemoglobin.  EGD revealed erosive gastritis.  Avoid NSAIDs.  Hemoglobin improved.  PVCs/palpitations -Monitor once again reviewed as above.  Echo from 2017 reassuring.  On low-dose Toprol.  Okay to take an extra if she is feeling more symptoms. -Knows to avoid stimulants caffeine decongestant Sudafed -We will refill her metoprolol.  Doing better.  Migraine --? What to take.  Try to avoid Excedrin Migraine because it does have aspirin.  Excedrin tension headache only has Tylenol and caffeine.  This would be reasonable.  Also for sinus, she may take Coricidin HBP.  Essential hypertension -Overall well controlled.  No changes  made.  Doing well.  Left anterior fascicular block -No syncope.  Overall doing well.  No changes.  Doing well.  Mild shortness of breath with activity -Continue to monitor clinically.  She does have knee osteoarthritis.  Her mild weight gain as well as decreased activity are likely playing a role in conditioning.  If this does worsen, she will let us know and we can always repeat her echocardiogram since it has been since 2017. -No real complaints about this today.   Medication Adjustments/Labs and Tests Ordered: Current medicines are reviewed at length with the patient today.  Concerns regarding medicines are outlined above.  Orders Placed This Encounter  Procedures  . EKG 12-Lead   No orders of the defined types were placed in this encounter.   Patient Instructions  Medication Instructions:  The current medical regimen is effective;  continue present plan and medications.  You may take Coricidin HBP for cold symptoms. You may take Excedrin Tension Headache formula as needed for headache.  Please may sure it has no Asprin in it.   *If you need a refill on your cardiac medications before your next appointment, please call your pharmacy*  Follow-Up: At [facility], you and your health needs are our priority.  As part of our continuing mission to provide you with exceptional heart care, we have created designated Provider Care Teams.  These Care Teams include your primary Cardiologist (physician) and Advanced Practice Providers (APPs -  Physician Assistants and Nurse Practitioners) who all work together to provide you with the care you need, when you need it.  We recommend signing up for the patient portal called "MyChart".  Sign up information is provided on this After Visit Summary.  MyChart is used to connect with patients for Virtual Visits (Telemedicine).  Patients are able to view lab/test results, encounter notes, upcoming appointments, etc.  Non-urgent messages can be sent to  your provider as well.   To learn more about what you can do with MyChart, go to [email or web address].    Your next appointment:   6 month(s)  The format for your next appointment:   In Person  Provider:  [name], MD  Thank you for choosing [facility]!!         Signed, [name], MD  01/20/2021 4:25 PM    [place]

## 2021-01-20 NOTE — Patient Instructions (Signed)
Medication Instructions:  The current medical regimen is effective;  continue present plan and medications.  You may take Coricidin HBP for cold symptoms. You may take Excedrin Tension Headache formula as needed for headache.  Please may sure it has no Asprin in it.   *If you need a refill on your cardiac medications before your next appointment, please call your pharmacy*  Follow-Up: At [facility], you and your health needs are our priority.  As part of our continuing mission to provide you with exceptional heart care, we have created designated Provider Care Teams.  These Care Teams include your primary Cardiologist (physician) and Advanced Practice Providers (APPs -  Physician Assistants and Nurse Practitioners) who all work together to provide you with the care you need, when you need it.  We recommend signing up for the patient portal called "MyChart".  Sign up information is provided on this After Visit Summary.  MyChart is used to connect with patients for Virtual Visits (Telemedicine).  Patients are able to view lab/test results, encounter notes, upcoming appointments, etc.  Non-urgent messages can be sent to your provider as well.   To learn more about what you can do with MyChart, go to [email or web address].    Your next appointment:   6 month(s)  The format for your next appointment:   In Person  Provider:   [name], MD  Thank you for choosing [facility]!!

## 2021-01-26 DIAGNOSIS — H43813 Vitreous degeneration, bilateral: Secondary | ICD-10-CM | POA: Diagnosis not present

## 2021-01-26 DIAGNOSIS — H40013 Open angle with borderline findings, low risk, bilateral: Secondary | ICD-10-CM | POA: Diagnosis not present

## 2021-03-02 DIAGNOSIS — I10 Essential (primary) hypertension: Secondary | ICD-10-CM | POA: Diagnosis not present

## 2021-03-02 DIAGNOSIS — E7800 Pure hypercholesterolemia, unspecified: Secondary | ICD-10-CM | POA: Diagnosis not present

## 2021-03-02 DIAGNOSIS — R7301 Impaired fasting glucose: Secondary | ICD-10-CM | POA: Diagnosis not present

## 2021-03-02 DIAGNOSIS — Z0000 Encounter for general adult medical examination without abnormal findings: Secondary | ICD-10-CM | POA: Diagnosis not present

## 2021-03-03 DIAGNOSIS — D62 Acute posthemorrhagic anemia: Secondary | ICD-10-CM | POA: Diagnosis not present

## 2021-03-03 DIAGNOSIS — K219 Gastro-esophageal reflux disease without esophagitis: Secondary | ICD-10-CM | POA: Diagnosis not present

## 2021-05-27 DIAGNOSIS — R1013 Epigastric pain: Secondary | ICD-10-CM | POA: Diagnosis not present

## 2021-05-27 DIAGNOSIS — R1012 Left upper quadrant pain: Secondary | ICD-10-CM | POA: Diagnosis not present

## 2021-05-27 DIAGNOSIS — K449 Diaphragmatic hernia without obstruction or gangrene: Secondary | ICD-10-CM | POA: Diagnosis not present

## 2021-07-07 DIAGNOSIS — J0110 Acute frontal sinusitis, unspecified: Secondary | ICD-10-CM | POA: Diagnosis not present

## 2021-08-24 DIAGNOSIS — H40013 Open angle with borderline findings, low risk, bilateral: Secondary | ICD-10-CM | POA: Diagnosis not present

## 2021-08-24 DIAGNOSIS — H2513 Age-related nuclear cataract, bilateral: Secondary | ICD-10-CM | POA: Diagnosis not present

## 2021-08-24 DIAGNOSIS — H25013 Cortical age-related cataract, bilateral: Secondary | ICD-10-CM | POA: Diagnosis not present

## 2021-08-24 DIAGNOSIS — H353132 Nonexudative age-related macular degeneration, bilateral, intermediate dry stage: Secondary | ICD-10-CM | POA: Diagnosis not present

## 2021-08-24 DIAGNOSIS — H53423 Scotoma of blind spot area, bilateral: Secondary | ICD-10-CM | POA: Diagnosis not present

## 2021-09-08 DIAGNOSIS — Z23 Encounter for immunization: Secondary | ICD-10-CM | POA: Diagnosis not present

## 2021-09-23 ENCOUNTER — Other Ambulatory Visit: Payer: Self-pay | Admitting: Gastroenterology

## 2021-09-23 DIAGNOSIS — R1013 Epigastric pain: Secondary | ICD-10-CM | POA: Diagnosis not present

## 2021-09-23 DIAGNOSIS — K449 Diaphragmatic hernia without obstruction or gangrene: Secondary | ICD-10-CM | POA: Diagnosis not present

## 2021-09-23 DIAGNOSIS — R030 Elevated blood-pressure reading, without diagnosis of hypertension: Secondary | ICD-10-CM | POA: Diagnosis not present

## 2021-09-24 ENCOUNTER — Other Ambulatory Visit: Payer: Self-pay | Admitting: Gastroenterology

## 2021-09-24 ENCOUNTER — Ambulatory Visit
Admission: RE | Admit: 2021-09-24 | Discharge: 2021-09-24 | Disposition: A | Discharge status: Home / Self Care | Payer: Medicare Other | Source: Ambulatory Visit | Attending: Gastroenterology | Admitting: Gastroenterology

## 2021-09-24 DIAGNOSIS — K224 Dyskinesia of esophagus: Secondary | ICD-10-CM | POA: Diagnosis not present

## 2021-09-24 DIAGNOSIS — R1013 Epigastric pain: Secondary | ICD-10-CM

## 2021-09-24 DIAGNOSIS — K219 Gastro-esophageal reflux disease without esophagitis: Secondary | ICD-10-CM | POA: Diagnosis not present

## 2021-09-24 DIAGNOSIS — K449 Diaphragmatic hernia without obstruction or gangrene: Secondary | ICD-10-CM | POA: Diagnosis not present

## 2021-10-13 DIAGNOSIS — N390 Urinary tract infection, site not specified: Secondary | ICD-10-CM | POA: Diagnosis not present

## 2021-10-13 DIAGNOSIS — R3129 Other microscopic hematuria: Secondary | ICD-10-CM | POA: Diagnosis not present

## 2021-10-13 DIAGNOSIS — R3915 Urgency of urination: Secondary | ICD-10-CM | POA: Diagnosis not present

## 2021-10-14 DIAGNOSIS — H35363 Drusen (degenerative) of macula, bilateral: Secondary | ICD-10-CM | POA: Diagnosis not present

## 2021-10-14 DIAGNOSIS — H353132 Nonexudative age-related macular degeneration, bilateral, intermediate dry stage: Secondary | ICD-10-CM | POA: Diagnosis not present

## 2021-10-14 DIAGNOSIS — H25013 Cortical age-related cataract, bilateral: Secondary | ICD-10-CM | POA: Diagnosis not present

## 2021-10-14 DIAGNOSIS — H40013 Open angle with borderline findings, low risk, bilateral: Secondary | ICD-10-CM | POA: Diagnosis not present

## 2021-10-14 DIAGNOSIS — H2513 Age-related nuclear cataract, bilateral: Secondary | ICD-10-CM | POA: Diagnosis not present

## 2021-10-14 DIAGNOSIS — H2511 Age-related nuclear cataract, right eye: Secondary | ICD-10-CM | POA: Diagnosis not present

## 2021-11-10 DIAGNOSIS — R1310 Dysphagia, unspecified: Secondary | ICD-10-CM | POA: Diagnosis not present

## 2021-11-10 DIAGNOSIS — I10 Essential (primary) hypertension: Secondary | ICD-10-CM | POA: Diagnosis not present

## 2021-11-23 DIAGNOSIS — Z85828 Personal history of other malignant neoplasm of skin: Secondary | ICD-10-CM | POA: Diagnosis not present

## 2021-11-23 DIAGNOSIS — L821 Other seborrheic keratosis: Secondary | ICD-10-CM | POA: Diagnosis not present

## 2021-11-23 DIAGNOSIS — Z129 Encounter for screening for malignant neoplasm, site unspecified: Secondary | ICD-10-CM | POA: Diagnosis not present

## 2021-11-23 DIAGNOSIS — D045 Carcinoma in situ of skin of trunk: Secondary | ICD-10-CM | POA: Diagnosis not present

## 2021-11-23 DIAGNOSIS — L304 Erythema intertrigo: Secondary | ICD-10-CM | POA: Diagnosis not present

## 2021-11-23 DIAGNOSIS — D485 Neoplasm of uncertain behavior of skin: Secondary | ICD-10-CM | POA: Diagnosis not present

## 2021-12-15 DIAGNOSIS — H25811 Combined forms of age-related cataract, right eye: Secondary | ICD-10-CM | POA: Diagnosis not present

## 2021-12-15 DIAGNOSIS — H2511 Age-related nuclear cataract, right eye: Secondary | ICD-10-CM | POA: Diagnosis not present

## 2021-12-16 ENCOUNTER — Encounter (HOSPITAL_COMMUNITY): Payer: Self-pay

## 2021-12-16 ENCOUNTER — Ambulatory Visit (HOSPITAL_COMMUNITY): Admit: 2021-12-16 | Payer: Medicare Other | Admitting: Gastroenterology

## 2021-12-16 SURGERY — MANOMETRY, ESOPHAGUS

## 2022-01-06 DIAGNOSIS — H2512 Age-related nuclear cataract, left eye: Secondary | ICD-10-CM | POA: Diagnosis not present

## 2022-01-06 DIAGNOSIS — H25012 Cortical age-related cataract, left eye: Secondary | ICD-10-CM | POA: Diagnosis not present

## 2022-01-19 DIAGNOSIS — H25812 Combined forms of age-related cataract, left eye: Secondary | ICD-10-CM | POA: Diagnosis not present

## 2022-01-19 DIAGNOSIS — H2512 Age-related nuclear cataract, left eye: Secondary | ICD-10-CM | POA: Diagnosis not present

## 2022-01-19 DIAGNOSIS — H25012 Cortical age-related cataract, left eye: Secondary | ICD-10-CM | POA: Diagnosis not present

## 2022-02-04 IMAGING — RF DG UGI W/ HIGH DENSITY W/O KUB
6 series · 14 of 24 positions shown · non-contrast
Comparison: 03/17/2017 esophagram.

CLINICAL DATA: Episodic epigastric abdominal pain. History of
esophageal dilation in 2925.

EXAM:
UPPER GI SERIES WITH KUB
TECHNIQUE: After obtaining a scout radiograph a routine upper GI series was
performed using thin and high density barium.
FLUOROSCOPY TIME:  Fluoroscopy Time:  3 minutes 6 seconds
Radiation Exposure Index (if provided by the fluoroscopic device):
24 mGy
Number of Acquired Spot Images: 11

[Series 1: one shot · 0.14mm/px · 6 of 12 slices shown (1 of 3)]
[im 1/12]
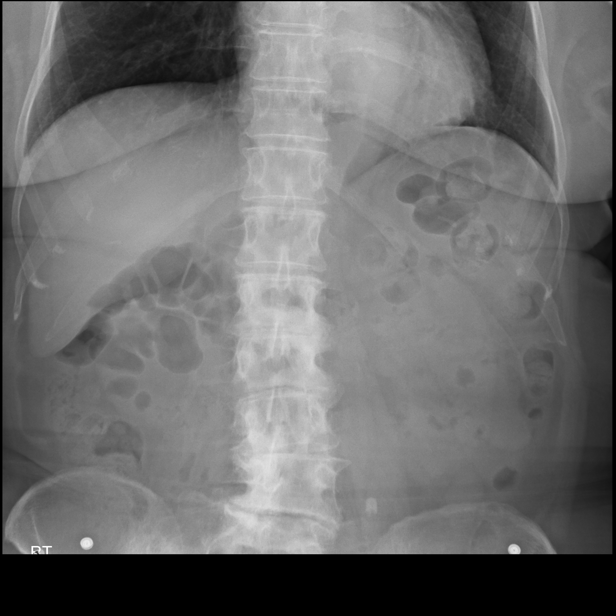
[im 3/12]
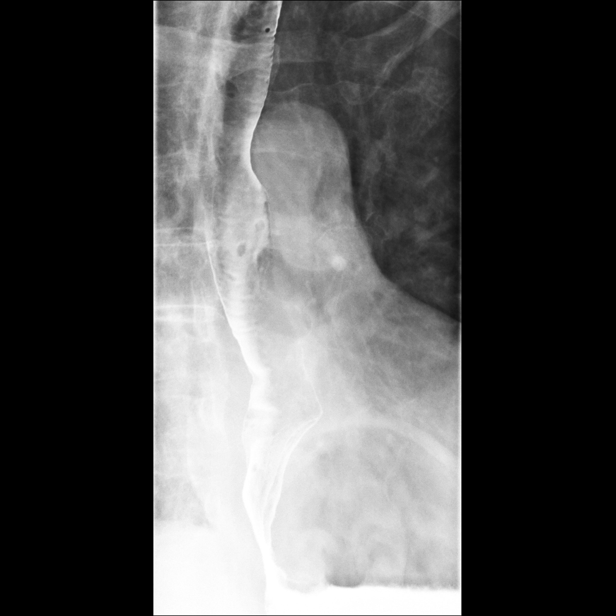
[im 6/12]
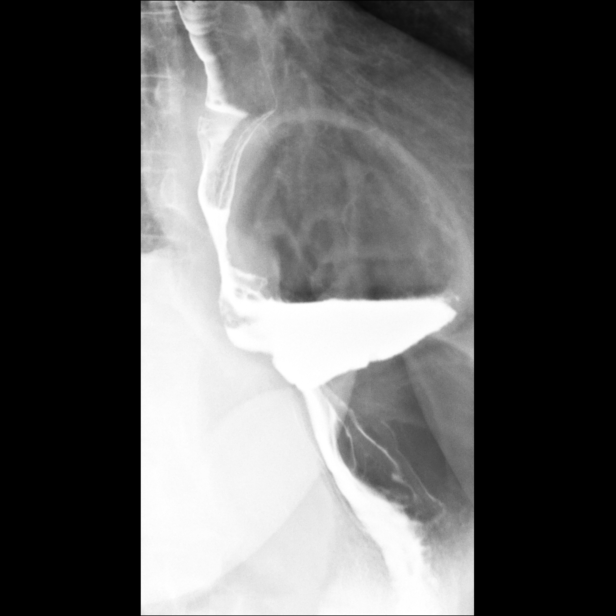
[im 8/12]
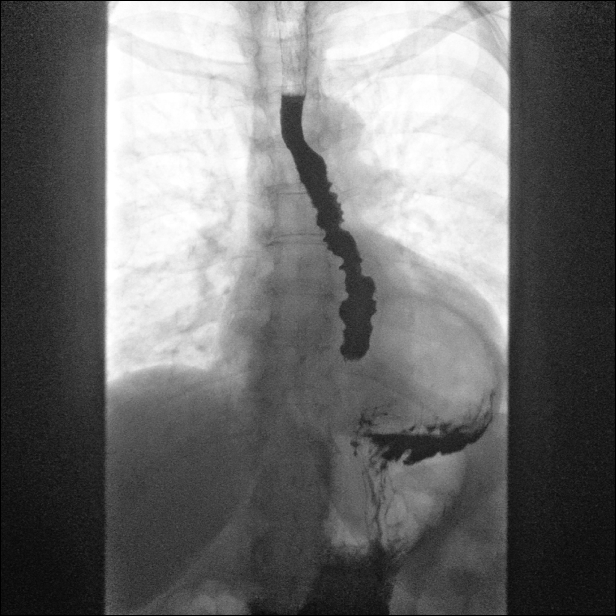
[im 9/12]
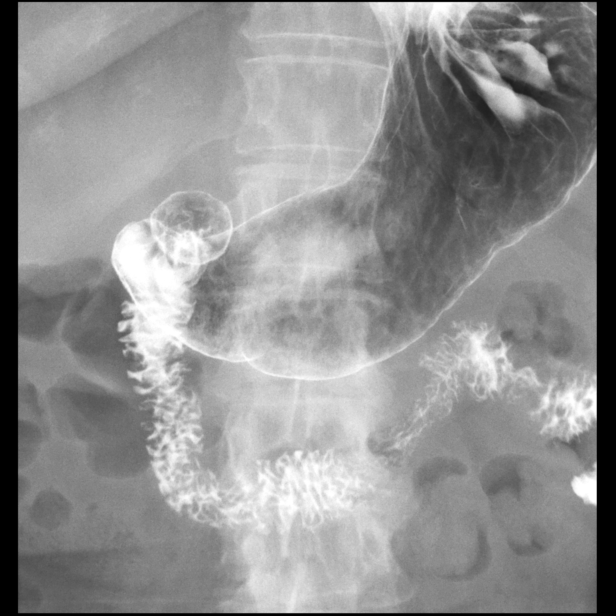
[im 11/12]
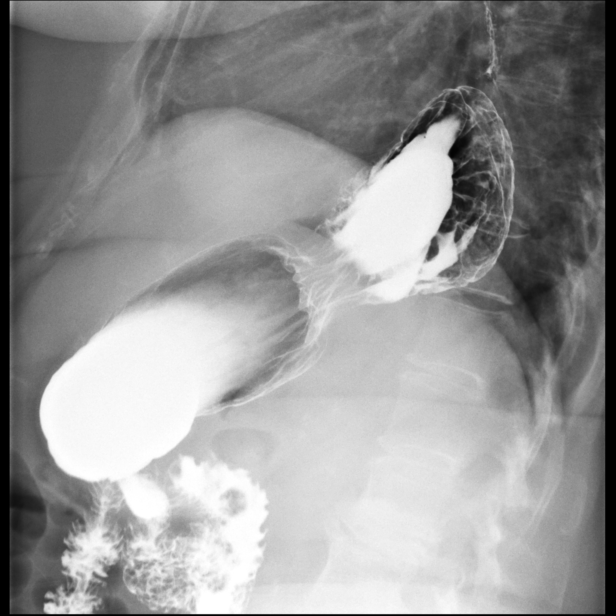

[Series 2: sequence · 2 of 27 frames shown (1 of 3)]
[frame 5/27]
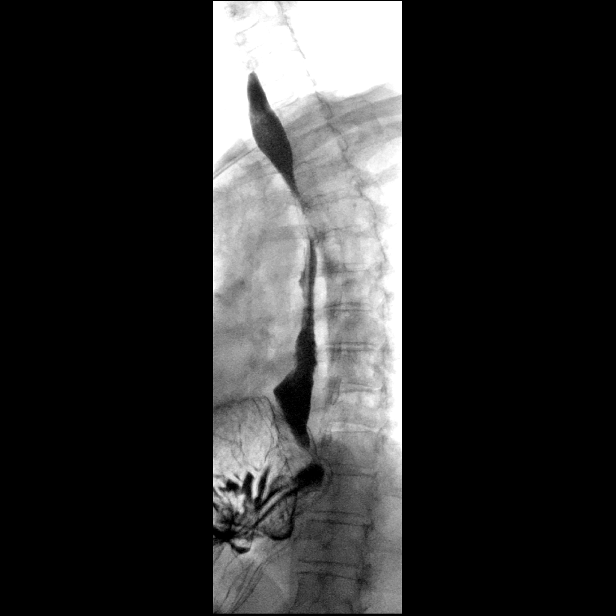
[frame 23/27]
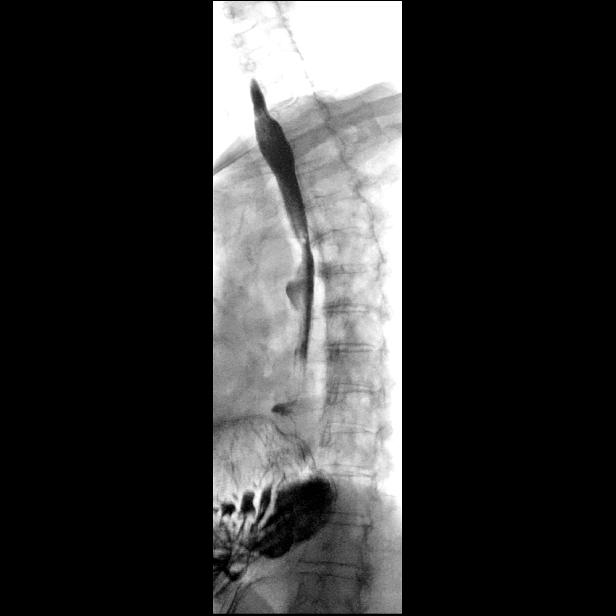

[Series 3: sequence · 2 of 94 frames shown (2 of 3)]
[frame 15/94]
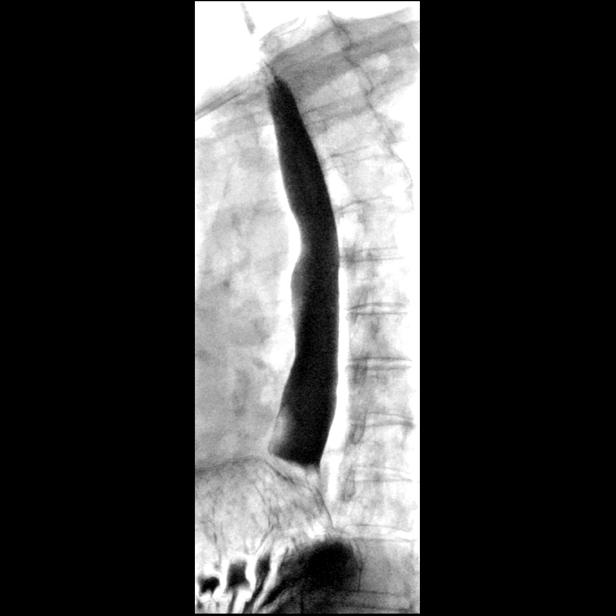
[frame 80/94]
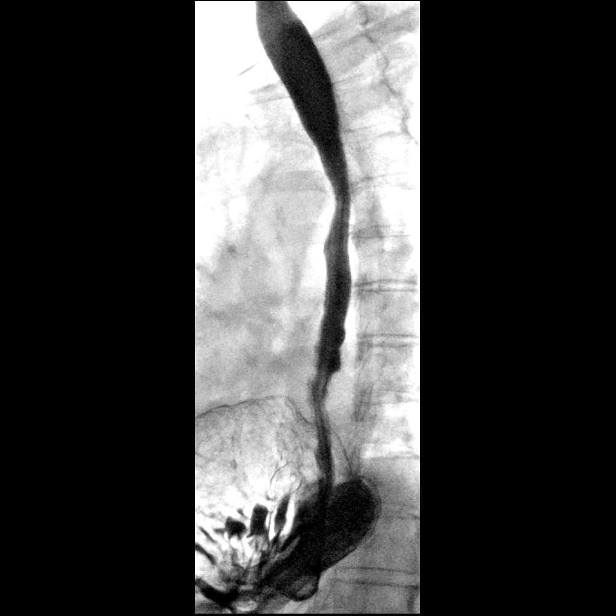

[Series 4: one shot · 2 of 3 slices shown (2 of 3)]
[im 2/3]
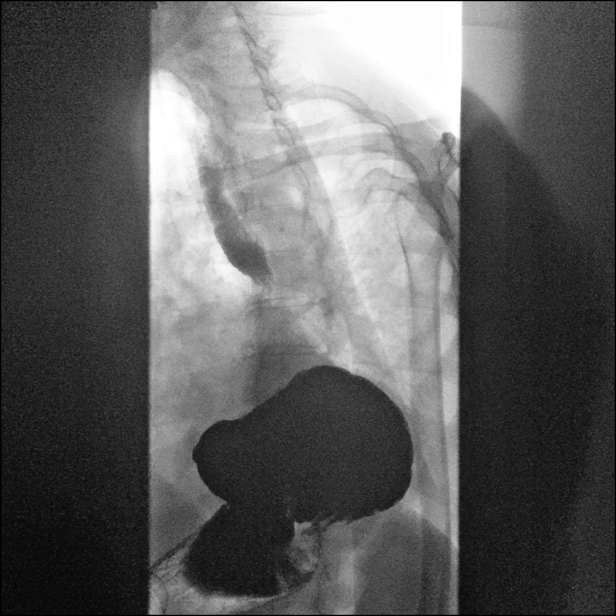
[im 3/3]
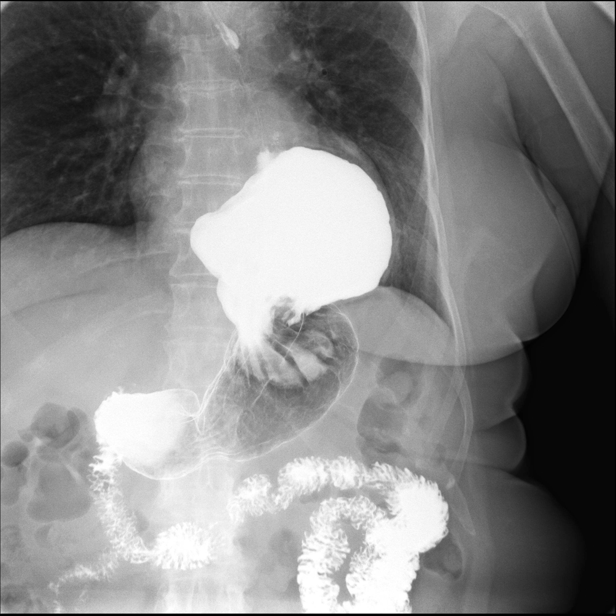

[Series 5: sequence · 1 of 110 frames shown (3 of 3)]
[frame 94/110]
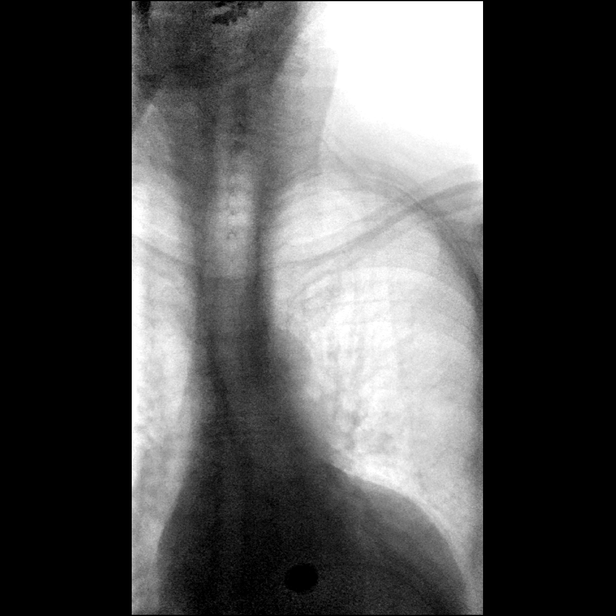

[Series 6: one shot · 1 of 1 slices shown (3 of 3)]
[im 1/1]
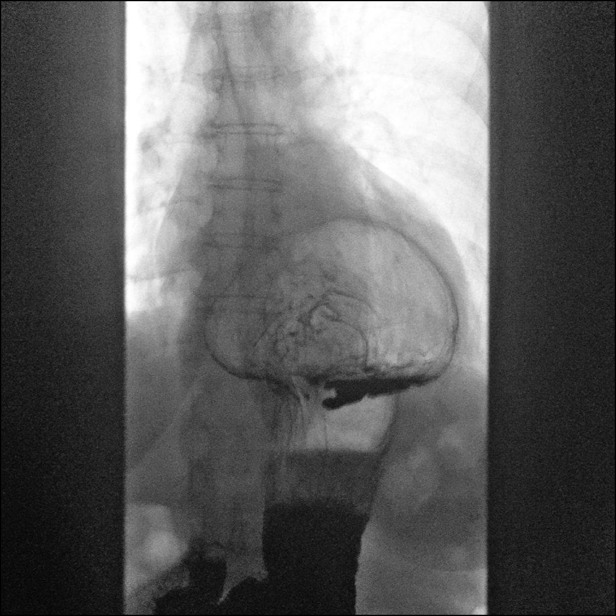

[14 of 24 positions shown; findings below may reference images not displayed]

FINDINGS: Scout radiograph demonstrated no dilated small bowel loops. Mild
colonic stool volume with no evidence of pneumatosis or
pneumoperitoneum. No radiopaque nephrolithiasis. Clear lung bases.
Marked lumbar spondylosis.

Fixed moderate to large hiatal hernia (approximately 40% of the
stomach is above the level of the diaphragms). No gastroesophageal
reflux elicited, despite provocative maneuvers including water
siphon test. Moderate esophageal dysmotility, characterized by
intermittent weakening of primary peristalsis throughout the
thoracic esophagus. Normal esophageal mucosa, with no evidence of
reflux esophagitis. Normal esophageal distensibility, with no
evidence of esophageal mass, ulcer or stricture. The swallowed 13 mm
barium tablet traversed the esophagus into the stomach without
delay. Normal gastric emptying. No gastric fold thickening, filling
defects or ulcers. Normal duodenal bulb and C sweep, with no
duodenal fold thickening, filling defects, strictures or ulcers.
Normal duodenal jejunal junction to the left of the spine.
Visualized proximal jejunal loops are normal caliber and without
fold thickening.
IMPRESSION: 1. Moderate to large hiatal hernia. No gastroesophageal reflux
elicited.
2. Moderate esophageal dysmotility, with a chronic reflux related
dysmotility pattern.
3. Otherwise normal upper GI, with no strictures, masses or ulcers
detected.

## 2022-03-01 DIAGNOSIS — D045 Carcinoma in situ of skin of trunk: Secondary | ICD-10-CM | POA: Diagnosis not present

## 2022-03-01 DIAGNOSIS — C44629 Squamous cell carcinoma of skin of left upper limb, including shoulder: Secondary | ICD-10-CM | POA: Diagnosis not present

## 2022-03-15 DIAGNOSIS — D509 Iron deficiency anemia, unspecified: Secondary | ICD-10-CM | POA: Diagnosis not present

## 2022-03-15 DIAGNOSIS — K2930 Chronic superficial gastritis without bleeding: Secondary | ICD-10-CM | POA: Diagnosis not present

## 2022-03-15 DIAGNOSIS — Z0000 Encounter for general adult medical examination without abnormal findings: Secondary | ICD-10-CM | POA: Diagnosis not present

## 2022-03-15 DIAGNOSIS — E7800 Pure hypercholesterolemia, unspecified: Secondary | ICD-10-CM | POA: Diagnosis not present

## 2022-03-15 DIAGNOSIS — R7303 Prediabetes: Secondary | ICD-10-CM | POA: Diagnosis not present

## 2022-03-15 DIAGNOSIS — I10 Essential (primary) hypertension: Secondary | ICD-10-CM | POA: Diagnosis not present

## 2022-03-29 DIAGNOSIS — C44629 Squamous cell carcinoma of skin of left upper limb, including shoulder: Secondary | ICD-10-CM | POA: Diagnosis not present

## 2022-03-29 DIAGNOSIS — D485 Neoplasm of uncertain behavior of skin: Secondary | ICD-10-CM | POA: Diagnosis not present

## 2022-03-29 DIAGNOSIS — L309 Dermatitis, unspecified: Secondary | ICD-10-CM | POA: Diagnosis not present

## 2022-04-01 DIAGNOSIS — L089 Local infection of the skin and subcutaneous tissue, unspecified: Secondary | ICD-10-CM | POA: Diagnosis not present

## 2022-04-01 DIAGNOSIS — L0109 Other impetigo: Secondary | ICD-10-CM | POA: Diagnosis not present

## 2022-04-14 DIAGNOSIS — C44629 Squamous cell carcinoma of skin of left upper limb, including shoulder: Secondary | ICD-10-CM | POA: Diagnosis not present

## 2022-04-28 DIAGNOSIS — C44629 Squamous cell carcinoma of skin of left upper limb, including shoulder: Secondary | ICD-10-CM | POA: Diagnosis not present

## 2022-05-13 DIAGNOSIS — H53423 Scotoma of blind spot area, bilateral: Secondary | ICD-10-CM | POA: Diagnosis not present

## 2022-05-13 DIAGNOSIS — H40013 Open angle with borderline findings, low risk, bilateral: Secondary | ICD-10-CM | POA: Diagnosis not present

## 2022-05-27 ENCOUNTER — Encounter: Payer: Self-pay | Admitting: Cardiology

## 2022-05-27 ENCOUNTER — Ambulatory Visit (INDEPENDENT_AMBULATORY_CARE_PROVIDER_SITE_OTHER): Payer: Medicare Other | Admitting: Cardiology

## 2022-05-27 VITALS — BP 130/80 | HR 77 | Ht 63.0 in | Wt 152.0 lb

## 2022-05-27 DIAGNOSIS — R002 Palpitations: Secondary | ICD-10-CM | POA: Diagnosis not present

## 2022-05-27 DIAGNOSIS — I10 Essential (primary) hypertension: Secondary | ICD-10-CM

## 2022-05-27 DIAGNOSIS — I444 Left anterior fascicular block: Secondary | ICD-10-CM

## 2022-05-27 NOTE — Patient Instructions (Signed)

## 2022-05-27 NOTE — Progress Notes (Signed)
Cardiology Office Note:    Date:  05/27/2022   ID:  Brenda Hunter, DOB [birth year], MRN [id number]  PCP:  Lawerance Cruel, MD  Cardiologist:  [name], MD  Electrophysiologist:  None   Referring MD: [name], MD    History of Present Illness:    Brenda Hunter is a 87 y.o. female here for the follow up of palpitations and hypertension.   Previously here for the follow-up of palpitations.  Back in 2017 she had complaints of heart fluttering to Dr. [name], skipped beats.  She was treated for atenolol several years ago in 2007 after she had extra beats.  Feels some nervousness with it.  She has had unifocal PVCs in the past.  Non-smoker no early family history of CAD.  LDL 110.  We utilized metoprolol.  Seem to help.  I take care of her husband, Brenda Hunter.  He is doing well-enjoying gardening.  Overall she feels well except for some mild shortness of breath with exertion.  When she gets into the grocery store for instance sometimes she feels as though she needs to take in a deep breath.  She has been limited by her knee osteoarthritis.  Meloxicam has been given and she takes occasionally.  This also has helped with her sciatica she states.  No chest pain no syncope no bleeding.  In 10/2020 Hg drop 12 to 8. Took too much Advil. EGD inflammation. Prilosec. Iron for 3 months. Bland diet. Back to 11 hgb.  At her last visit, from a cardiac perspective she was doing fairly well with minimal palpitations.   Today:  She says she has been doing pretty good.  She recalls when she had an episode involving internal bleeding a year ago. She went to see Dr Harrington Challenger, and it was revealed that her hemoglobin dropped from 12 to 8. She had an endoscopy which revealed inflammation and a large hiatal hernia. The bleeding was determined to be a result of Advil which she was taking due to her arthritis. She was taking meloxicam for sciatica. At this time her sciatica is resolved, and arthritis is improved  but still present.  Her blood pressure had been elevated. She was instructed by her PCP to take an additional 1/2 tablet of metoprolol in the evenings for 2-3 days. This normalized her blood pressure. Currently she is taking 1 tablet of metoprolol in the morning.   She is very conscientious of following a healthy diet. She cooks all of her meals, which often include vegetables and sweet potatoes. She ate spinach and kale frequently some months ago. Of note, she states feeling satiated after about a cup measurement of food.   She has been exercising more. She completes 30 minutes of floor exercises each morning to work on her core and pelvis. She states that she does about 25 sit-ups as well. However, she notes that she can't walk as far as she used to without becoming short winded.  She denies any palpitations, chest pain, or peripheral edema. No lightheadedness, headaches, syncope, orthopnea, or PND.    Past Medical History:  Diagnosis Date   Arthritis    KNEES   Complication of anesthesia    MALES LOTS OF URINE AFTER ANESTHESIA   Dysrhythmia    GERD (gastroesophageal reflux disease)    Hematuria    Bladder Spasms (Dr. [name])   Hiatal hernia    History of rectal polyps    HOH (hard of hearing)    LEFT EAR  Peripheral edema     Past Surgical History:  Procedure Laterality Date   BASAL CELL REMOVED FROM LIP  09/2012   COLONSCOPY     CYSTECTOMY     left breats benign   ESOPHAGOGASTRODUODENOSCOPY (EGD) WITH PROPOFOL N/A 05/04/2017   Procedure: ESOPHAGOGASTRODUODENOSCOPY (EGD) WITH PROPOFOL;  Surgeon: [name], MD;  Location: [facility];  Service: Endoscopy;  Laterality: N/A;   ESOPHAGOGASTRODUODENOSCOPY ENDOSCOPY     HAMMER TOE SURGERY     right foot 2016   KNEE ARTHROSCOPY     right knee   RECTAL POLYPECTOMY     rectal polyps   ROTATOR CUFF REPAIR     right shoulder   SAVORY DILATION N/A 05/04/2017   Procedure: SAVORY DILATION;  Surgeon: [name], MD;   Location: [facility];  Service: Endoscopy;  Laterality: N/A;   TOTAL ABDOMINAL HYSTERECTOMY     2001    Current Medications: Current Meds  Medication Sig   methenamine (HIPREX) 1 g tablet Take 1 g by mouth 2 (two) times daily.   metoprolol succinate (TOPROL-XL) 25 MG 24 hr tablet TAKE ONE (1) TABLET BY MOUTH EACH DAY   omeprazole (PRILOSEC OTC) 20 MG tablet Take 20 mg by mouth daily before breakfast.   oxybutynin (DITROPAN) 5 MG tablet Take 5 mg by mouth daily as needed for bladder spasms.   pyridOXINE (VITAMIN B-6) 100 MG tablet Take 100 mg by mouth daily.   vitamin B-12 (CYANOCOBALAMIN) 1000 MCG tablet Take 1,000 mcg by mouth daily.     Allergies:   Macrodantin [nitrofurantoin macrocrystal] and Percocet [oxycodone-acetaminophen]   Social History   Socioeconomic History   Marital status: Married    Spouse name: [name]   Number of children: 2   Years of education: Not on file   Highest education level: Not on file  Occupational History   Occupation: retired  Tobacco Use   Smoking status: Never   Smokeless tobacco: Never  Vaping Use   Vaping Use: Never used  Substance and Sexual Activity   Alcohol use: No   Drug use: No   Sexual activity: Not on file  Other Topics Concern   Not on file  Social History Narrative   Not on file   Social Determinants of Health   Financial Resource Strain: Not on file  Food Insecurity: Not on file  Transportation Needs: Not on file  Physical Activity: Not on file  Stress: Not on file  Social Connections: Not on file     Family History: The patient's family history includes Diverticulitis in her sister; Heart Problems in her father; Heart attack in her mother; Heart disease in her mother; Kidney failure in her sister; Lung cancer in her sister; Prostate cancer in her father; Renal Disease in her sister.  ROS:   Please see the history of present illness.   (+) Exertional shortness of breath (+) Arthralgias   All other systems  reviewed and are negative.  EKGs/Labs/Other Studies Reviewed:    The following studies were reviewed today:  ECHO 09/06/16: - Left ventricle: The cavity size was normal. Systolic function was   normal. The estimated ejection fraction was in the range of 60%   to 65%. Wall motion was normal; there were no regional wall   motion abnormalities. Doppler parameters are consistent with   abnormal left ventricular relaxation (grade 1 diastolic   dysfunction). GLS: -23.1% - Mitral valve: There was trivial regurgitation. - Right ventricle: The cavity size was mildly dilated. Wall  thickness was normal. - Right atrium: The atrium was mildly dilated. - Tricuspid valve: There was mild regurgitation.   Holter 24hr 09/06/16:  Normal sinus rhythm, 1 PVC, 1 PAC.  No atrial fibrillation  No pauses   Reassuring monitor  EKG: EKG is personally reviewed.  05/27/22: Sinus rhythm. LAFB, poor R wave progression. 01/20/21: sinus rhythm 86 left anterior fascicular block borderline LVH, poor R wave progression 12/26/2018: sinus rhythm 87 with left anterior fascicular block LVH pattern.  Recent Labs: No results found for requested labs within last 365 days.  Recent Lipid Panel No results found for: "CHOL", "TRIG", "HDL", "CHOLHDL", "VLDL", "LDLCALC", "LDLDIRECT"  Physical Exam:    VS:  BP 130/80 (BP Location: Left Arm, Patient Position: Sitting, Cuff Size: Normal)   Pulse 77   Ht '5\' 3"'$  (1.6 m)   Wt 152 lb (68.9 kg)   BMI 26.93 kg/m     Wt Readings from Last 3 Encounters:  05/27/22 152 lb (68.9 kg)  01/20/21 154 lb (69.9 kg)  01/11/20 160 lb (72.6 kg)     GEN: Well nourished, well developed, in no acute distress  HEENT: normal  Neck: no JVD, carotid bruits, or masses Cardiac: RRR; no murmurs, rubs, or gallops Respiratory:  clear to auscultation bilaterally, normal work of breathing GI: soft, nontender, nondistended, + BS MS: no deformity or atrophy  Skin: warm and dry, no rash, no  edema Neuro:  Alert and Oriented x 3, Strength and sensation are intact Psych: euthymic mood, full affect   ASSESSMENT:    1. Palpitations   2. Essential hypertension   3. LAFB (left anterior fascicular block)     PLAN:    In order of problems listed above:  Peptic ulcer disease/gastritis -Drop in hemoglobin.  EGD revealed erosive gastritis.  Avoid NSAIDs.  Hemoglobin improved.  GI from [place].  Hiatal hernia.  She is being careful.  She can only eat about 1 cup of food.  PVCs/palpitations -Monitor once again reviewed as above.  Echo from 2017 reassuring.  On low-dose Toprol.  Okay to take an extra if she is feeling more symptoms. -Knows to avoid stimulants caffeine decongestant Sudafed -We will refill her metoprolol.    Essential hypertension -Overall well controlled.  Doing well.  Left anterior fascicular block - No high risk symptoms such as syncope  Mild shortness of breath with activity - She says its probably because she is 86.  She is quite active however.  Exercises.  Does chair exercises.  Continue.  Follow-up: 1 year  Medication Adjustments/Labs and Tests Ordered: Current medicines are reviewed at length with the patient today.  Concerns regarding medicines are outlined above.  Orders Placed This Encounter  Procedures   EKG 12-Lead   No orders of the defined types were placed in this encounter.   Patient Instructions  Medication Instructions:  The current medical regimen is effective;  continue present plan and medications.  *If you need a refill on your cardiac medications before your next appointment, please call your pharmacy*  Follow-Up: At [facility], you and your health needs are our priority.  As part of our continuing mission to provide you with exceptional heart care, we have created designated Provider Care Teams.  These Care Teams include your primary Cardiologist (physician) and Advanced Practice Providers (APPs -  Physician Assistants and  Nurse Practitioners) who all work together to provide you with the care you need, when you need it.  We recommend signing up for the patient portal  called "MyChart".  Sign up information is provided on this After Visit Summary.  MyChart is used to connect with patients for Virtual Visits (Telemedicine).  Patients are able to view lab/test results, encounter notes, upcoming appointments, etc.  Non-urgent messages can be sent to your provider as well.   To learn more about what you can do with MyChart, go to [email or web address].    Your next appointment:   1 year(s)  The format for your next appointment:   In Person  Provider:   [name], MD {    Important Information About Sugar          I,Breanna Adamick,acting as a scribe for [name], MD.,have documented all relevant documentation on the behalf of [name], MD,as directed by  [name], MD while in the presence of [name], MD.   I, [name], MD, have reviewed all documentation for this visit. The documentation on 05/27/22 for the exam, diagnosis, procedures, and orders are all accurate and complete.    Signed, [name], MD  05/27/2022 3:01 PM    [facility]

## 2022-06-16 DIAGNOSIS — J019 Acute sinusitis, unspecified: Secondary | ICD-10-CM | POA: Diagnosis not present

## 2022-08-18 DIAGNOSIS — Z23 Encounter for immunization: Secondary | ICD-10-CM | POA: Diagnosis not present

## 2022-09-09 DIAGNOSIS — H40013 Open angle with borderline findings, low risk, bilateral: Secondary | ICD-10-CM | POA: Diagnosis not present

## 2022-09-09 DIAGNOSIS — H43393 Other vitreous opacities, bilateral: Secondary | ICD-10-CM | POA: Diagnosis not present

## 2022-09-09 DIAGNOSIS — H35363 Drusen (degenerative) of macula, bilateral: Secondary | ICD-10-CM | POA: Diagnosis not present

## 2022-09-09 DIAGNOSIS — H353132 Nonexudative age-related macular degeneration, bilateral, intermediate dry stage: Secondary | ICD-10-CM | POA: Diagnosis not present

## 2022-11-29 DIAGNOSIS — Z129 Encounter for screening for malignant neoplasm, site unspecified: Secondary | ICD-10-CM | POA: Diagnosis not present

## 2022-11-29 DIAGNOSIS — Z85828 Personal history of other malignant neoplasm of skin: Secondary | ICD-10-CM | POA: Diagnosis not present

## 2022-11-29 DIAGNOSIS — L821 Other seborrheic keratosis: Secondary | ICD-10-CM | POA: Diagnosis not present

## 2022-11-29 DIAGNOSIS — L820 Inflamed seborrheic keratosis: Secondary | ICD-10-CM | POA: Diagnosis not present

## 2023-03-10 DIAGNOSIS — H7292 Unspecified perforation of tympanic membrane, left ear: Secondary | ICD-10-CM | POA: Insufficient documentation

## 2023-03-10 DIAGNOSIS — H906 Mixed conductive and sensorineural hearing loss, bilateral: Secondary | ICD-10-CM | POA: Diagnosis not present

## 2023-03-17 DIAGNOSIS — R7303 Prediabetes: Secondary | ICD-10-CM | POA: Diagnosis not present

## 2023-03-17 DIAGNOSIS — E7800 Pure hypercholesterolemia, unspecified: Secondary | ICD-10-CM | POA: Diagnosis not present

## 2023-03-17 DIAGNOSIS — D509 Iron deficiency anemia, unspecified: Secondary | ICD-10-CM | POA: Diagnosis not present

## 2023-03-17 DIAGNOSIS — I10 Essential (primary) hypertension: Secondary | ICD-10-CM | POA: Diagnosis not present

## 2023-03-25 DIAGNOSIS — D509 Iron deficiency anemia, unspecified: Secondary | ICD-10-CM | POA: Diagnosis not present

## 2023-03-25 DIAGNOSIS — I10 Essential (primary) hypertension: Secondary | ICD-10-CM | POA: Diagnosis not present

## 2023-03-25 DIAGNOSIS — R7303 Prediabetes: Secondary | ICD-10-CM | POA: Diagnosis not present

## 2023-03-25 DIAGNOSIS — Z6828 Body mass index (BMI) 28.0-28.9, adult: Secondary | ICD-10-CM | POA: Diagnosis not present

## 2023-03-25 DIAGNOSIS — Z0000 Encounter for general adult medical examination without abnormal findings: Secondary | ICD-10-CM | POA: Diagnosis not present

## 2023-03-25 DIAGNOSIS — E7800 Pure hypercholesterolemia, unspecified: Secondary | ICD-10-CM | POA: Diagnosis not present

## 2023-03-28 DIAGNOSIS — R3915 Urgency of urination: Secondary | ICD-10-CM | POA: Diagnosis not present

## 2023-03-28 DIAGNOSIS — N390 Urinary tract infection, site not specified: Secondary | ICD-10-CM | POA: Diagnosis not present

## 2023-03-28 DIAGNOSIS — R3129 Other microscopic hematuria: Secondary | ICD-10-CM | POA: Diagnosis not present

## 2023-04-08 DIAGNOSIS — J019 Acute sinusitis, unspecified: Secondary | ICD-10-CM | POA: Diagnosis not present

## 2023-04-19 DIAGNOSIS — H40013 Open angle with borderline findings, low risk, bilateral: Secondary | ICD-10-CM | POA: Diagnosis not present

## 2023-05-13 ENCOUNTER — Inpatient Hospital Stay (HOSPITAL_BASED_OUTPATIENT_CLINIC_OR_DEPARTMENT_OTHER)
Admission: EM | Admit: 2023-05-13 | Discharge: 2023-05-19 | DRG: 327 | Disposition: A | Discharge status: Home / Self Care | Payer: Medicare Other | Attending: Internal Medicine | Admitting: Internal Medicine

## 2023-05-13 ENCOUNTER — Other Ambulatory Visit: Payer: Self-pay

## 2023-05-13 ENCOUNTER — Encounter (HOSPITAL_BASED_OUTPATIENT_CLINIC_OR_DEPARTMENT_OTHER): Payer: Self-pay

## 2023-05-13 DIAGNOSIS — Z8249 Family history of ischemic heart disease and other diseases of the circulatory system: Secondary | ICD-10-CM | POA: Diagnosis not present

## 2023-05-13 DIAGNOSIS — Z888 Allergy status to other drugs, medicaments and biological substances status: Secondary | ICD-10-CM

## 2023-05-13 DIAGNOSIS — K449 Diaphragmatic hernia without obstruction or gangrene: Secondary | ICD-10-CM

## 2023-05-13 DIAGNOSIS — N281 Cyst of kidney, acquired: Secondary | ICD-10-CM | POA: Diagnosis not present

## 2023-05-13 DIAGNOSIS — R1012 Left upper quadrant pain: Secondary | ICD-10-CM | POA: Diagnosis not present

## 2023-05-13 DIAGNOSIS — K440 Diaphragmatic hernia with obstruction, without gangrene: Secondary | ICD-10-CM | POA: Diagnosis not present

## 2023-05-13 DIAGNOSIS — Z885 Allergy status to narcotic agent status: Secondary | ICD-10-CM

## 2023-05-13 DIAGNOSIS — E663 Overweight: Secondary | ICD-10-CM | POA: Diagnosis present

## 2023-05-13 DIAGNOSIS — R1084 Generalized abdominal pain: Secondary | ICD-10-CM | POA: Diagnosis not present

## 2023-05-13 DIAGNOSIS — K219 Gastro-esophageal reflux disease without esophagitis: Secondary | ICD-10-CM | POA: Diagnosis present

## 2023-05-13 DIAGNOSIS — K3189 Other diseases of stomach and duodenum: Secondary | ICD-10-CM | POA: Diagnosis not present

## 2023-05-13 DIAGNOSIS — K311 Adult hypertrophic pyloric stenosis: Principal | ICD-10-CM | POA: Diagnosis present

## 2023-05-13 DIAGNOSIS — Z6826 Body mass index (BMI) 26.0-26.9, adult: Secondary | ICD-10-CM | POA: Diagnosis not present

## 2023-05-13 DIAGNOSIS — Z8711 Personal history of peptic ulcer disease: Secondary | ICD-10-CM

## 2023-05-13 DIAGNOSIS — D214 Benign neoplasm of connective and other soft tissue of abdomen: Secondary | ICD-10-CM | POA: Diagnosis not present

## 2023-05-13 DIAGNOSIS — E876 Hypokalemia: Secondary | ICD-10-CM | POA: Diagnosis not present

## 2023-05-13 DIAGNOSIS — K222 Esophageal obstruction: Secondary | ICD-10-CM | POA: Diagnosis present

## 2023-05-13 DIAGNOSIS — K5730 Diverticulosis of large intestine without perforation or abscess without bleeding: Secondary | ICD-10-CM | POA: Diagnosis not present

## 2023-05-13 DIAGNOSIS — I499 Cardiac arrhythmia, unspecified: Secondary | ICD-10-CM

## 2023-05-13 DIAGNOSIS — I444 Left anterior fascicular block: Secondary | ICD-10-CM | POA: Diagnosis present

## 2023-05-13 DIAGNOSIS — Z4682 Encounter for fitting and adjustment of non-vascular catheter: Secondary | ICD-10-CM | POA: Diagnosis not present

## 2023-05-13 DIAGNOSIS — Z841 Family history of disorders of kidney and ureter: Secondary | ICD-10-CM

## 2023-05-13 DIAGNOSIS — R748 Abnormal levels of other serum enzymes: Secondary | ICD-10-CM | POA: Diagnosis not present

## 2023-05-13 DIAGNOSIS — R600 Localized edema: Secondary | ICD-10-CM

## 2023-05-13 DIAGNOSIS — R112 Nausea with vomiting, unspecified: Secondary | ICD-10-CM | POA: Insufficient documentation

## 2023-05-13 DIAGNOSIS — Z886 Allergy status to analgesic agent status: Secondary | ICD-10-CM

## 2023-05-13 DIAGNOSIS — R1013 Epigastric pain: Principal | ICD-10-CM

## 2023-05-13 DIAGNOSIS — M1990 Unspecified osteoarthritis, unspecified site: Secondary | ICD-10-CM

## 2023-05-13 DIAGNOSIS — Z79899 Other long term (current) drug therapy: Secondary | ICD-10-CM

## 2023-05-13 DIAGNOSIS — I10 Essential (primary) hypertension: Secondary | ICD-10-CM | POA: Diagnosis present

## 2023-05-13 DIAGNOSIS — R1111 Vomiting without nausea: Secondary | ICD-10-CM | POA: Diagnosis not present

## 2023-05-13 DIAGNOSIS — R918 Other nonspecific abnormal finding of lung field: Secondary | ICD-10-CM | POA: Diagnosis not present

## 2023-05-13 DIAGNOSIS — R1319 Other dysphagia: Secondary | ICD-10-CM | POA: Diagnosis not present

## 2023-05-13 DIAGNOSIS — H9190 Unspecified hearing loss, unspecified ear: Secondary | ICD-10-CM

## 2023-05-13 DIAGNOSIS — C49A2 Gastrointestinal stromal tumor of stomach: Secondary | ICD-10-CM | POA: Diagnosis not present

## 2023-05-13 DIAGNOSIS — D509 Iron deficiency anemia, unspecified: Secondary | ICD-10-CM | POA: Diagnosis present

## 2023-05-13 LAB — COMPREHENSIVE METABOLIC PANEL
ALT: 27 U/L (ref 0–44)
AST: 36 U/L (ref 15–41)
Albumin: 4.5 g/dL (ref 3.5–5.0)
Alkaline Phosphatase: 64 U/L (ref 38–126)
Anion gap: 13 (ref 5–15)
BUN: 18 mg/dL (ref 8–23)
CO2: 24 mmol/L (ref 22–32)
Calcium: 9.4 mg/dL (ref 8.9–10.3)
Chloride: 100 mmol/L (ref 98–111)
Creatinine, Ser: 0.61 mg/dL (ref 0.44–1.00)
GFR, Estimated: >60 mL/min (ref >60)
Glucose, Bld: 161 mg/dL — ABNORMAL HIGH (ref 70–99)
Potassium: 4.1 mmol/L (ref 3.5–5.1)
Sodium: 137 mmol/L (ref 135–145)
Total Bilirubin: 0.5 mg/dL (ref 0.3–1.2)
Total Protein: 8.3 g/dL — ABNORMAL HIGH (ref 6.5–8.1)

## 2023-05-13 LAB — CBC WITH DIFFERENTIAL/PLATELET
Abs Immature Granulocytes: 0.05 10*3/uL (ref 0.00–0.07)
Basophils Absolute: 0 10*3/uL (ref 0.0–0.1)
Basophils Relative: 0 %
Eosinophils Absolute: 0 10*3/uL (ref 0.0–0.5)
Eosinophils Relative: 0 %
HCT: 47.3 % — ABNORMAL HIGH (ref 36.0–46.0)
Hemoglobin: 15.4 g/dL — ABNORMAL HIGH (ref 12.0–15.0)
Immature Granulocytes: 1 %
Lymphocytes Relative: 12 %
Lymphs Abs: 1.2 10*3/uL (ref 0.7–4.0)
MCH: 29.1 pg (ref 26.0–34.0)
MCHC: 32.6 g/dL (ref 30.0–36.0)
MCV: 89.2 fL (ref 80.0–100.0)
Monocytes Absolute: 0.4 10*3/uL (ref 0.1–1.0)
Monocytes Relative: 4 %
Neutro Abs: 8.7 10*3/uL — ABNORMAL HIGH (ref 1.7–7.7)
Neutrophils Relative %: 83 %
Platelets: 308 10*3/uL (ref 150–400)
RBC: 5.3 MIL/uL — ABNORMAL HIGH (ref 3.87–5.11)
RDW: 13.5 % (ref 11.5–15.5)
WBC: 10.4 10*3/uL (ref 4.0–10.5)
nRBC: 0 % (ref 0.0–0.2)

## 2023-05-13 LAB — LIPASE, BLOOD: Lipase: 131 U/L — ABNORMAL HIGH (ref 11–51)

## 2023-05-13 MED ORDER — MORPHINE SULFATE (PF) 4 MG/ML IV SOLN
4.0000 mg | Freq: Once | INTRAVENOUS | Status: AC
  Administered 2023-05-13: 4 mg via INTRAVENOUS
  Filled 2023-05-13: qty 1

## 2023-05-13 MED ORDER — ONDANSETRON HCL 4 MG/2ML IJ SOLN
4.0000 mg | Freq: Once | INTRAMUSCULAR | Status: AC
  Administered 2023-05-13: 4 mg via INTRAVENOUS
  Filled 2023-05-13: qty 2

## 2023-05-13 MED ORDER — SODIUM CHLORIDE 0.9 % IV BOLUS
1000.0000 mL | Freq: Once | INTRAVENOUS | Status: AC
  Administered 2023-05-13: 1000 mL via INTRAVENOUS

## 2023-05-13 NOTE — ED Triage Notes (Signed)
Pt came in via EMS, complains of right upper abdominal pain, and N/V, and headache.Hx of abdominal hernia since she was 88 y/o. States she went out to eat around 1400 and had pain since.

## 2023-05-13 NOTE — ED Provider Notes (Signed)
[facility] Provider Note   CSN: [id number] Arrival date & time: 05/13/23  2228     History {Add pertinent medical, surgical, social history, OB history to HPI:1} Chief Complaint  Patient presents with   Abdominal Pain    Brenda Hunter is a 88 y.o. female.  The history is provided by the patient.  Abdominal Pain She has history of hypertension, hyperlipidemia and comes in complaining of epigastric and left upper quadrant pain since 2:00 after eating a pastry.  Pain does radiate to the back.  There has been associated nausea and vomiting.  She has been evaluated by gastroenterologist who told her that pain was from a hiatal hernia.  She did take a dose of famotidine without any relief.  She has never had a gallbladder ultrasound, but states that she knows it is not her gallbladder.   Home Medications Prior to Admission medications   Medication Sig Start Date End Date Taking? Authorizing Provider  methenamine (HIPREX) 1 g tablet Take 1 g by mouth 2 (two) times daily. 05/17/17   [provider]  metoprolol succinate (TOPROL-XL) 25 MG 24 hr tablet TAKE ONE (1) TABLET BY MOUTH EACH DAY 01/20/21   Jake Bathe, MD  omeprazole (PRILOSEC OTC) 20 MG tablet Take 20 mg by mouth daily before breakfast.    [provider]  oxybutynin (DITROPAN) 5 MG tablet Take 5 mg by mouth daily as needed for bladder spasms.    [provider]  pyridOXINE (VITAMIN B-6) 100 MG tablet Take 100 mg by mouth daily.    [provider]  vitamin B-12 (CYANOCOBALAMIN) 1000 MCG tablet Take 1,000 mcg by mouth daily.    [provider]      Allergies    Macrodantin [nitrofurantoin macrocrystal] and Percocet [oxycodone-acetaminophen]    Review of Systems   Review of Systems  Gastrointestinal:  Positive for abdominal pain.  All other systems reviewed and are negative.   Physical Exam Updated Vital Signs BP (!) 174/78    Pulse (!) 116   Temp 98.1 F (36.7 C) (Oral)   Resp 18   Ht 5\' 3"  (1.6 m)   Wt 68.5 kg   SpO2 97%   BMI 26.75 kg/m  Physical Exam Vitals and nursing note reviewed.   88 year old female, appears moderately uncomfortable, but is in no acute distress. Vital signs are significant for elevated blood pressure and elevated heart rate. Oxygen saturation is 97%, which is normal. Head is normocephalic and atraumatic. PERRLA, EOMI. Oropharynx is clear. Neck is nontender and supple without adenopathy or JVD. Back is nontender and there is no CVA tenderness. Lungs are clear without rales, wheezes, or rhonchi. Chest is nontender. Heart has regular rate and rhythm without murmur. Abdomen is soft, flat, with moderate epigastric and left upper quadrant pain.  There is no right upper quadrant tenderness. Extremities have no cyanosis or edema, full range of motion is present. Skin is warm and dry without rash. Neurologic: Mental status is normal, cranial nerves are intact, moves all extremities equally.  ED Results / Procedures / Treatments   Labs (all labs ordered are listed, but only abnormal results are displayed) Labs Reviewed  LIPASE, BLOOD  COMPREHENSIVE METABOLIC PANEL  CBC  URINALYSIS, ROUTINE W REFLEX MICROSCOPIC    EKG None  Radiology No results found.  Procedures Procedures  {Document cardiac monitor, telemetry assessment procedure when appropriate:1}  Medications Ordered in ED Medications - No data to display  ED Course/ Medical Decision Making/ A&P   {   Click here for ABCD2, HEART and other calculatorsREFRESH Note before signing :1}                          Medical Decision Making Amount and/or Complexity of Data Reviewed Labs: ordered.   Upper abdominal pain consistent with exacerbation of hiatal hernia.  Differential diagnosis includes, but is not limited to, biliary colic, gastritis, peptic ulcer disease, pancreatitis, diverticulitis, referred pain from cardiac  cause.  This differential includes conditions with high risk of morbidity and complications.  I have reviewed her past records, and on 09/24/2021, she had a upper GI with double contrast which showed a moderate size hiatal hernia.  No right upper quadrant ultrasound on record, but CT of abdomen and pelvis on 12/31/2018 2010 did not see any gallstones.  I have ordered laboratory workup of CBC, competence of metabolic panel, lipase.  Have ordered electrocardiogram.  I have ordered IV fluids, morphine, ondansetron.  {Document critical care time when appropriate:1} {Document review of labs and clinical decision tools ie heart score, Chads2Vasc2 etc:1}  {Document your independent review of radiology images, and any outside records:1} {Document your discussion with family members, caretakers, and with consultants:1} {Document social determinants of health affecting pt's care:1} {Document your decision making why or why not admission, treatments were needed:1} Final Clinical Impression(s) / ED Diagnoses Final diagnoses:  None    Rx / DC Orders ED Discharge Orders     None

## 2023-05-14 ENCOUNTER — Emergency Department (HOSPITAL_BASED_OUTPATIENT_CLINIC_OR_DEPARTMENT_OTHER): Payer: Medicare Other

## 2023-05-14 ENCOUNTER — Encounter (HOSPITAL_BASED_OUTPATIENT_CLINIC_OR_DEPARTMENT_OTHER): Payer: Self-pay

## 2023-05-14 DIAGNOSIS — R918 Other nonspecific abnormal finding of lung field: Secondary | ICD-10-CM | POA: Diagnosis not present

## 2023-05-14 DIAGNOSIS — N281 Cyst of kidney, acquired: Secondary | ICD-10-CM | POA: Diagnosis not present

## 2023-05-14 DIAGNOSIS — C49A2 Gastrointestinal stromal tumor of stomach: Secondary | ICD-10-CM | POA: Diagnosis not present

## 2023-05-14 DIAGNOSIS — K440 Diaphragmatic hernia with obstruction, without gangrene: Secondary | ICD-10-CM

## 2023-05-14 DIAGNOSIS — I499 Cardiac arrhythmia, unspecified: Secondary | ICD-10-CM

## 2023-05-14 DIAGNOSIS — K222 Esophageal obstruction: Secondary | ICD-10-CM

## 2023-05-14 DIAGNOSIS — K5730 Diverticulosis of large intestine without perforation or abscess without bleeding: Secondary | ICD-10-CM | POA: Diagnosis not present

## 2023-05-14 DIAGNOSIS — K3189 Other diseases of stomach and duodenum: Secondary | ICD-10-CM

## 2023-05-14 DIAGNOSIS — K311 Adult hypertrophic pyloric stenosis: Secondary | ICD-10-CM | POA: Diagnosis present

## 2023-05-14 DIAGNOSIS — M1990 Unspecified osteoarthritis, unspecified site: Secondary | ICD-10-CM

## 2023-05-14 DIAGNOSIS — K449 Diaphragmatic hernia without obstruction or gangrene: Secondary | ICD-10-CM

## 2023-05-14 DIAGNOSIS — D509 Iron deficiency anemia, unspecified: Secondary | ICD-10-CM | POA: Diagnosis present

## 2023-05-14 DIAGNOSIS — Z8711 Personal history of peptic ulcer disease: Secondary | ICD-10-CM | POA: Diagnosis not present

## 2023-05-14 DIAGNOSIS — H9190 Unspecified hearing loss, unspecified ear: Secondary | ICD-10-CM

## 2023-05-14 DIAGNOSIS — I444 Left anterior fascicular block: Secondary | ICD-10-CM | POA: Diagnosis present

## 2023-05-14 DIAGNOSIS — E663 Overweight: Secondary | ICD-10-CM | POA: Insufficient documentation

## 2023-05-14 DIAGNOSIS — N819 Female genital prolapse, unspecified: Secondary | ICD-10-CM | POA: Insufficient documentation

## 2023-05-14 DIAGNOSIS — Z888 Allergy status to other drugs, medicaments and biological substances status: Secondary | ICD-10-CM | POA: Diagnosis not present

## 2023-05-14 DIAGNOSIS — R748 Abnormal levels of other serum enzymes: Secondary | ICD-10-CM | POA: Diagnosis not present

## 2023-05-14 DIAGNOSIS — R112 Nausea with vomiting, unspecified: Secondary | ICD-10-CM

## 2023-05-14 DIAGNOSIS — Z886 Allergy status to analgesic agent status: Secondary | ICD-10-CM | POA: Diagnosis not present

## 2023-05-14 DIAGNOSIS — R1013 Epigastric pain: Secondary | ICD-10-CM | POA: Diagnosis not present

## 2023-05-14 DIAGNOSIS — R1012 Left upper quadrant pain: Secondary | ICD-10-CM | POA: Diagnosis not present

## 2023-05-14 DIAGNOSIS — R1319 Other dysphagia: Secondary | ICD-10-CM | POA: Diagnosis not present

## 2023-05-14 DIAGNOSIS — Z6826 Body mass index (BMI) 26.0-26.9, adult: Secondary | ICD-10-CM | POA: Diagnosis not present

## 2023-05-14 DIAGNOSIS — Z79899 Other long term (current) drug therapy: Secondary | ICD-10-CM | POA: Diagnosis not present

## 2023-05-14 DIAGNOSIS — Z8719 Personal history of other diseases of the digestive system: Secondary | ICD-10-CM | POA: Insufficient documentation

## 2023-05-14 DIAGNOSIS — Z4682 Encounter for fitting and adjustment of non-vascular catheter: Secondary | ICD-10-CM | POA: Diagnosis not present

## 2023-05-14 DIAGNOSIS — K219 Gastro-esophageal reflux disease without esophagitis: Secondary | ICD-10-CM

## 2023-05-14 DIAGNOSIS — D214 Benign neoplasm of connective and other soft tissue of abdomen: Secondary | ICD-10-CM | POA: Diagnosis not present

## 2023-05-14 DIAGNOSIS — Z8249 Family history of ischemic heart disease and other diseases of the circulatory system: Secondary | ICD-10-CM | POA: Diagnosis not present

## 2023-05-14 DIAGNOSIS — R600 Localized edema: Secondary | ICD-10-CM

## 2023-05-14 DIAGNOSIS — E876 Hypokalemia: Secondary | ICD-10-CM | POA: Diagnosis not present

## 2023-05-14 DIAGNOSIS — I10 Essential (primary) hypertension: Secondary | ICD-10-CM | POA: Diagnosis present

## 2023-05-14 DIAGNOSIS — Z885 Allergy status to narcotic agent status: Secondary | ICD-10-CM | POA: Diagnosis not present

## 2023-05-14 DIAGNOSIS — Z841 Family history of disorders of kidney and ureter: Secondary | ICD-10-CM | POA: Diagnosis not present

## 2023-05-14 HISTORY — DX: Nausea with vomiting, unspecified: R11.2

## 2023-05-14 LAB — URINALYSIS, COMPLETE (UACMP) WITH MICROSCOPIC
Bacteria, UA: NONE SEEN
Bilirubin Urine: NEGATIVE
Glucose, UA: NEGATIVE mg/dL
Ketones, ur: NEGATIVE mg/dL
Leukocytes,Ua: NEGATIVE
Nitrite: NEGATIVE
Protein, ur: 30 mg/dL — AB
Specific Gravity, Urine: 1.024 (ref 1.005–1.030)
pH: 5 (ref 5.0–8.0)

## 2023-05-14 LAB — URINALYSIS, MICROSCOPIC (REFLEX): Bacteria, UA: NONE SEEN

## 2023-05-14 LAB — URINALYSIS, ROUTINE W REFLEX MICROSCOPIC
Bilirubin Urine: NEGATIVE
Glucose, UA: NEGATIVE mg/dL
Ketones, ur: 15 mg/dL — AB
Leukocytes,Ua: NEGATIVE
Nitrite: NEGATIVE
Protein, ur: 100 mg/dL — AB
Specific Gravity, Urine: >=1.03 (ref 1.005–1.030)
pH: 6 (ref 5.0–8.0)

## 2023-05-14 LAB — TROPONIN I (HIGH SENSITIVITY): Troponin I (High Sensitivity): 7 ng/L (ref <18)

## 2023-05-14 MED ORDER — HYDROMORPHONE HCL 1 MG/ML IJ SOLN
0.5000 mg | Freq: Once | INTRAMUSCULAR | Status: AC
  Administered 2023-05-14: 0.5 mg via INTRAVENOUS
  Filled 2023-05-14: qty 1

## 2023-05-14 MED ORDER — BISACODYL 10 MG RE SUPP: 10.0000 mg | Freq: Two times a day (BID) | RECTAL | Status: DC | PRN

## 2023-05-14 MED ORDER — MORPHINE SULFATE (PF) 4 MG/ML IV SOLN: 4.0000 mg | Freq: Once | INTRAVENOUS | Status: AC

## 2023-05-14 MED ORDER — METHOCARBAMOL 1000 MG/10ML IJ SOLN
1000.0000 mg | Freq: Four times a day (QID) | INTRAVENOUS | Status: DC | PRN
  Administered 2023-05-17: 1000 mg via INTRAVENOUS
  Filled 2023-05-14: qty 1000

## 2023-05-14 MED ORDER — MORPHINE SULFATE (PF) 2 MG/ML IV SOLN
2.0000 mg | INTRAVENOUS | Status: DC | PRN
  Administered 2023-05-14 – 2023-05-17 (×3): 2 mg via INTRAVENOUS
  Filled 2023-05-14 (×3): qty 1

## 2023-05-14 MED ORDER — ALUM & MAG HYDROXIDE-SIMETH 200-200-20 MG/5ML PO SUSP: 30.0000 mL | Freq: Four times a day (QID) | ORAL | Status: DC | PRN

## 2023-05-14 MED ORDER — FENTANYL CITRATE PF 50 MCG/ML IJ SOSY: 12.5000 ug | PREFILLED_SYRINGE | INTRAMUSCULAR | Status: DC | PRN

## 2023-05-14 MED ORDER — LACTATED RINGERS IV BOLUS: 1000.0000 mL | Freq: Three times a day (TID) | INTRAVENOUS | Status: AC | PRN

## 2023-05-14 MED ORDER — PROCHLORPERAZINE EDISYLATE 10 MG/2ML IJ SOLN
5.0000 mg | INTRAMUSCULAR | Status: DC | PRN
  Administered 2023-05-17: 10 mg via INTRAVENOUS
  Filled 2023-05-14: qty 2

## 2023-05-14 MED ORDER — DIPHENHYDRAMINE HCL 50 MG/ML IJ SOLN: 12.5000 mg | Freq: Four times a day (QID) | INTRAMUSCULAR | Status: DC | PRN

## 2023-05-14 MED ORDER — ENOXAPARIN SODIUM 40 MG/0.4ML IJ SOSY
40.0000 mg | PREFILLED_SYRINGE | INTRAMUSCULAR | Status: DC
  Administered 2023-05-14 – 2023-05-19 (×5): 40 mg via SUBCUTANEOUS
  Filled 2023-05-14 (×5): qty 0.4

## 2023-05-14 MED ORDER — SODIUM CHLORIDE 0.9 % IV SOLN: 8.0000 mg | Freq: Four times a day (QID) | INTRAVENOUS | Status: DC | PRN

## 2023-05-14 MED ORDER — METOCLOPRAMIDE HCL 5 MG/ML IJ SOLN: 5.0000 mg | Freq: Three times a day (TID) | INTRAMUSCULAR | Status: DC | PRN

## 2023-05-14 MED ORDER — ONDANSETRON HCL 4 MG/2ML IJ SOLN
4.0000 mg | Freq: Four times a day (QID) | INTRAMUSCULAR | Status: DC | PRN
  Administered 2023-05-14 – 2023-05-17 (×2): 4 mg via INTRAVENOUS
  Filled 2023-05-14 (×2): qty 2

## 2023-05-14 MED ORDER — ALBUTEROL SULFATE (2.5 MG/3ML) 0.083% IN NEBU: 2.5000 mg | INHALATION_SOLUTION | RESPIRATORY_TRACT | Status: DC | PRN

## 2023-05-14 MED ORDER — METOCLOPRAMIDE HCL 5 MG/ML IJ SOLN
5.0000 mg | Freq: Three times a day (TID) | INTRAMUSCULAR | Status: AC
  Administered 2023-05-14 – 2023-05-16 (×6): 5 mg via INTRAVENOUS
  Filled 2023-05-14 (×6): qty 2

## 2023-05-14 MED ORDER — SIMETHICONE 40 MG/0.6ML PO SUSP: 80.0000 mg | Freq: Four times a day (QID) | ORAL | Status: DC

## 2023-05-14 MED ORDER — PANTOPRAZOLE SODIUM 40 MG IV SOLR
40.0000 mg | Freq: Two times a day (BID) | INTRAVENOUS | Status: DC
  Administered 2023-05-14 – 2023-05-18 (×9): 40 mg via INTRAVENOUS
  Filled 2023-05-14 (×9): qty 10

## 2023-05-14 MED ORDER — ACETAMINOPHEN 650 MG RE SUPP: 650.0000 mg | Freq: Four times a day (QID) | RECTAL | Status: DC | PRN

## 2023-05-14 MED ORDER — MENTHOL 3 MG MT LOZG: 1.0000 | LOZENGE | OROMUCOSAL | Status: DC | PRN

## 2023-05-14 MED ORDER — PHENOL 1.4 % MT LIQD
2.0000 | OROMUCOSAL | Status: DC | PRN
  Filled 2023-05-14: qty 177

## 2023-05-14 MED ORDER — METOPROLOL TARTRATE 5 MG/5ML IV SOLN: 5.0000 mg | Freq: Four times a day (QID) | INTRAVENOUS | Status: DC | PRN

## 2023-05-14 MED ORDER — IOHEXOL 300 MG/ML  SOLN
100.0000 mL | Freq: Once | INTRAMUSCULAR | Status: AC | PRN
  Administered 2023-05-14: 100 mL via INTRAVENOUS

## 2023-05-14 MED ORDER — MORPHINE SULFATE (PF) 4 MG/ML IV SOLN
INTRAVENOUS | Status: AC
  Administered 2023-05-14: 4 mg via INTRAVENOUS
  Filled 2023-05-14: qty 1

## 2023-05-14 MED ORDER — SIMETHICONE 40 MG/0.6ML PO SUSP: 80.0000 mg | Freq: Four times a day (QID) | ORAL | Status: DC | PRN

## 2023-05-14 MED ORDER — MORPHINE SULFATE (PF) 2 MG/ML IV SOLN: 1.0000 mg | INTRAVENOUS | Status: DC | PRN

## 2023-05-14 MED ORDER — HYDROMORPHONE HCL 1 MG/ML IJ SOLN
1.0000 mg | Freq: Once | INTRAMUSCULAR | Status: AC
  Administered 2023-05-14: 1 mg via INTRAVENOUS
  Filled 2023-05-14: qty 1

## 2023-05-14 MED ORDER — SODIUM CHLORIDE 0.9 % IV SOLN: INTRAVENOUS | Status: DC

## 2023-05-14 MED ORDER — MAGIC MOUTHWASH
15.0000 mL | Freq: Four times a day (QID) | ORAL | Status: DC | PRN
  Administered 2023-05-14: 15 mL via ORAL
  Filled 2023-05-14 (×2): qty 15

## 2023-05-14 MED ORDER — SIMETHICONE 40 MG/0.6ML PO SUSP
80.0000 mg | Freq: Four times a day (QID) | ORAL | Status: DC
  Administered 2023-05-14 – 2023-05-15 (×3): 80 mg
  Filled 2023-05-14 (×3): qty 1.2

## 2023-05-14 MED ORDER — PROCHLORPERAZINE EDISYLATE 10 MG/2ML IJ SOLN
10.0000 mg | Freq: Once | INTRAMUSCULAR | Status: AC
  Administered 2023-05-14: 10 mg via INTRAVENOUS
  Filled 2023-05-14: qty 2

## 2023-05-14 MED ORDER — LACTATED RINGERS IV BOLUS
1000.0000 mL | Freq: Once | INTRAVENOUS | Status: AC
  Administered 2023-05-14: 1000 mL via INTRAVENOUS

## 2023-05-14 NOTE — ED Notes (Signed)
Pt dozing off and sats drop to 87%. 2 L [place] applied.

## 2023-05-14 NOTE — ED Provider Notes (Signed)
Patient arrived from outside facility pending surgery evaluation.  She is comfortable with NG tube to low wall suction.  Abdomen is soft without peritoneal signs.  Vital signs are stable.  She declines any further pain or nausea medications.  Discussed with general surgery Dr. Corliss Skains. He is with a trauma patient.  Will have someone evaluate after 7 AM. Request to hold hospitalist admission until that time   Glynn Octave, MD 05/14/23 [phone]

## 2023-05-14 NOTE — Plan of Care (Signed)
  Problem: Education: Goal: Knowledge of General Education information will improve Description Including pain rating scale, medication(s)/side effects and non-pharmacologic comfort measures Outcome: Progressing   

## 2023-05-14 NOTE — Consult Note (Addendum)
Brenda Hunter  [birth year] [id number]  CARE TEAM:  PCP: Daisy Floro, MD  Outpatient Care Team: Patient Care Team: Daisy Floro, MD as PCP - General (Family Medicine) Jake Bathe, MD as PCP - Cardiology (Cardiology) Jamison Neighbor, MD (Urology) Kathi Der, MD as Consulting Physician (Gastroenterology)  Inpatient Treatment Team: Treatment Team:  Glynn Octave, MD Merton Border, RN Arlean Hopping, MD Dorian Furnace, NT [facility], Md, MD Darnelle Catalan, RN Alvester Chou, NT   This patient is a 88 y.o.female who presents today for surgical evaluation at the request of Dr Manus Gunning.   Chief complaint / Reason for evaluation: Incarcerated hiatal hernia  88 year old woman.  History of some palpitations and chronic cystitis.  Has had heartburn reflux and known hiatal hernia with esophageal stricture for many years.  Followed by [facility] gastrology.  Stricture dilated about 5 years ago.  Used to be cared for by Dr. Vilinda Boehringer with [facility].  Now by Dr.  Levora Angel.  Has had worsening episodes of dysphagia to solids.  Was found to be anemic and workup imply that she was getting some gastritis/Cameron ulcerations from her hiatal hernia.  When I asked if she had seen a surgeon, she claims she has been told by Gastroenterology that she is too old for hiatal hernia surgery.    Patient has tried to do mainly liquids and soft foods.  Yesterday she had an episode of uncontrolled nausea and vomiting after having crepes with fruit yesterday.  Could not keep anything down.  Brought to the emergency department.  CT scan confirms half of her stomach up in her chest with incarcerated hiatal hernia.  Surgical consultation requested.  Patient claims to do floor exercises every day and can walk about 20 minutes without difficulty.  She has been followed by cardiology in the past.  She has some palpitations &  fascicular block that seems to be controlled on metoprolol.   She has had some pelvic floor issues including chronic cystitis.  She has had pelvic surgeries but no upper abdominal surgery.  She claims to her bowels once a day.  She does have a history of colorectal polyps.  Last endoscopy about 5 years ago was negative.  She is here with her husband and son.   Assessment  Brenda Hunter  88 y.o. female       Problem List:  Active Problems:   LAFB (left anterior fascicular block)   Incarcerated hiatal hernia   Nausea & vomiting   Esophageal stricture   HOH (hard of hearing)   Hiatal hernia   GERD (gastroesophageal reflux disease)   Dysrhythmia   Arthritis   Peripheral edema   Chronic hiatal hernia incarcerated now with nausea and vomiting and edema and probable at least partial obstruction  Plan:  Admission  IV fluid resuscitation.  Nausea and pain control.  Aggressive IV PPI.  NG tube for decompression.  I am concerned that her hiatal hernia is significant of and causing worsening dysphagia obstructive symptoms that she ultimately is going to require surgery to fix.  She recalls being told that she was too old for this.  Admittedly you have to think twice in an 88 year old but she seems rather functionally active and can tolerate it.  Will see if there is any further workup needed for medical or cardiac clearance.  We will try and calm this down with aggressive PPIs and NG tube.  If she opens up then perhaps  transition to clear/blenderized pured food and robotic hiatal hernia repair in the near future.  It may be to the point that it will not resolve without surgery this hospitalization.  We will see.  Patient very hesitant to consider surgery.  She is not in shock, there is no perforation or pneumatosis, she does not have severe bleeding anemia at this time.  There is no indication for emergent surgery, especially on the weekend.  See if we can optimize and delay intervention and more ideal situation pending medical/cardiac clearance to  assess risk objectively.   The anatomy & physiology of the foregut and anti-reflux mechanism was discussed.  The pathophysiology of hiatal herniation and GERD was discussed.  Natural history risks without surgery was discussed.   The patient's symptoms are not adequately controlled by medicines and other non-operative treatments.  I feel the risks of no intervention will lead to serious problems that outweigh the operative risks; therefore, I recommended surgery to reduce the hiatal hernia out of the chest and fundoplication to rebuild the anti-reflux valve and control reflux better.  Need for a thorough workup to rule out the differential diagnosis and plan treatment was explained.  I explained minimally invasive techniques with possible need for an open approach.  Risks such as bleeding, infection, abscess, leak,injury to other organs, need for repair of tissues / organs, need for further treatment, stroke, heart attack, death, and other risks were discussed.   I noted a good likelihood this will help address the problem.  Goals of post-operative recovery were discussed as well.  Possibility that this will not correct all symptoms was explained.  Post-operative dysphagia, need for short-term liquid & pureed diet, inability to vomit, possibility of reherniation, possible need for medicines to help control symptoms in addition to surgery were discussed.  We will work to minimize complications.   Educational handouts further explaining the pathology, treatment options, and dysphagia diet was given as well.  Questions were answered.  The patient expresses understanding & wishes to hold of on surgery for now.  -VTE prophylaxis- SCDs, etc  -mobilize as tolerated to help recovery  I reviewed nursing notes, ED provider notes, last 24 h vitals and pain scores, last 48 h intake and output, last 24 h labs and trends, and last 24 h imaging results. I have reviewed this patient's available data, including medical  history, events of note, test results, etc as part of my evaluation.  A significant portion of that time was spent in counseling.  Care during the described time interval was provided by me.  This care required moderate level of medical decision making.  05/14/2023  Ardeth Sportsman, MD, FACS, MASCRS Esophageal, Gastrointestinal & Colorectal Surgery Robotic and Minimally Invasive Surgery  [facility] [address] [place], [place] [address] [phone] Fax [phone] Main  CONTACT INFORMATION: Weekday (9AM-5PM): Call [facility] main office at [phone] Weeknight (5PM-9AM) or Weekend/Holiday: Check EPIC "Web Links" tab & use "AMION" (password " [email or web address]") for General Surgery [facility] coverage  Please, DO NOT use SecureChat  (it is not reliable communication to reach operating surgeons & will lead to a delay in care).   Epic staff messaging available for outptient concerns needing 1-2 business day response.      05/14/2023      Past Medical History:  Diagnosis Date   Arthritis    KNEES   Complication of anesthesia    MALES LOTS OF URINE AFTER ANESTHESIA  Dysrhythmia    GERD (gastroesophageal reflux disease)    Hematuria    Bladder Spasms (Dr. Logan Bores)   Hiatal hernia    History of rectal polyps    HOH (hard of hearing)    LEFT EAR   Nausea & vomiting 05/14/2023   Peripheral edema     Past Surgical History:  Procedure Laterality Date   BASAL CELL REMOVED FROM LIP  09/2012   COLONSCOPY     CYSTECTOMY     left breats benign   ESOPHAGOGASTRODUODENOSCOPY (EGD) WITH PROPOFOL N/A 05/04/2017   Procedure: ESOPHAGOGASTRODUODENOSCOPY (EGD) WITH PROPOFOL;  Surgeon: Carman Ching, MD;  Location: [facility];  Service: Endoscopy;  Laterality: N/A;   ESOPHAGOGASTRODUODENOSCOPY ENDOSCOPY     HAMMER TOE SURGERY     right foot 2016   KNEE ARTHROSCOPY     right knee   RECTAL POLYPECTOMY     rectal polyps   ROTATOR CUFF  REPAIR     right shoulder   SAVORY DILATION N/A 05/04/2017   Procedure: SAVORY DILATION;  Surgeon: Carman Ching, MD;  Location: [facility];  Service: Endoscopy;  Laterality: N/A;   TOTAL ABDOMINAL HYSTERECTOMY     2001    Social History   Socioeconomic History   Marital status: Married    Spouse name: Chrissie Noa   Number of children: 2   Years of education: Not on file   Highest education level: Not on file  Occupational History   Occupation: retired  Tobacco Use   Smoking status: Never   Smokeless tobacco: Never  Vaping Use   Vaping status: Never Used  Substance and Sexual Activity   Alcohol use: No   Drug use: No   Sexual activity: Not on file  Other Topics Concern   Not on file  Social History Narrative   Not on file   Social Determinants of Health   Financial Resource Strain: Not on file  Food Insecurity: Not on file  Transportation Needs: Not on file  Physical Activity: Not on file  Stress: Not on file  Social Connections: Unknown (03/09/2022)   Received from [facility], [facility]   Social Network    Social Network: Not on file  Intimate Partner Violence: Unknown (01/28/2022)   Received from [facility], [facility]   HITS    Physically Hurt: Not on file    Insult or Talk Down To: Not on file    Threaten Physical Harm: Not on file    Scream or Curse: Not on file    Family History  Problem Relation Age of Onset   Heart attack Mother    Heart disease Mother    Prostate cancer Father    Heart Problems Father    Kidney failure Sister    Renal Disease Sister    Lung cancer Sister    Diverticulitis Sister     Current Facility-Administered Medications  Medication Dose Route Frequency Provider Last Rate Last Admin   acetaminophen (TYLENOL) suppository 650 mg  650 mg Rectal Q6H PRN Karie Soda, MD       alum & mag hydroxide-simeth (MAALOX/MYLANTA) 200-200-20 MG/5ML suspension 30 mL  30 mL Oral Q6H PRN Karie Soda, MD       bisacodyl  (DULCOLAX) suppository 10 mg  10 mg Rectal Q12H PRN Karie Soda, MD       diphenhydrAMINE (BENADRYL) injection 12.5-25 mg  12.5-25 mg Intravenous Q6H PRN Karie Soda, MD       fentaNYL (SUBLIMAZE) injection 12.5-25 mcg  12.5-25 mcg  Intravenous Q2H PRN Karie Soda, MD       lactated ringers bolus 1,000 mL  1,000 mL Intravenous Once Karie Soda, MD       lactated ringers bolus 1,000 mL  1,000 mL Intravenous Q8H PRN Karie Soda, MD       magic mouthwash  15 mL Oral QID PRN Karie Soda, MD       menthol-cetylpyridinium (CEPACOL) lozenge 3 mg  1 lozenge Oral PRN Karie Soda, MD       methocarbamol (ROBAXIN) 1,000 mg in dextrose 5 % 100 mL IVPB  1,000 mg Intravenous Q6H PRN Karie Soda, MD       metoCLOPramide (REGLAN) injection 5-10 mg  5-10 mg Intravenous Q8H PRN Karie Soda, MD       metoprolol tartrate (LOPRESSOR) injection 5 mg  5 mg Intravenous Q6H PRN Karie Soda, MD       ondansetron [facility]) injection 4 mg  4 mg Intravenous Q6H PRN Karie Soda, MD       Or   ondansetron (ZOFRAN) 8 mg in sodium chloride 0.9 % 50 mL IVPB  8 mg Intravenous Q6H PRN Karie Soda, MD       phenol (CHLORASEPTIC) mouth spray 2 spray  2 spray Mouth/Throat PRN Karie Soda, MD       prochlorperazine (COMPAZINE) injection 5-10 mg  5-10 mg Intravenous Q4H PRN Karie Soda, MD       simethicone (MYLICON) 40 MG/0.6ML suspension 80 mg  80 mg Oral QID PRN Karie Soda, MD       Current Outpatient Medications  Medication Sig Dispense Refill   methenamine (HIPREX) 1 g tablet Take 1 g by mouth 2 (two) times daily.     metoprolol succinate (TOPROL-XL) 25 MG 24 hr tablet TAKE ONE (1) TABLET BY MOUTH EACH DAY 90 tablet 3   omeprazole (PRILOSEC OTC) 20 MG tablet Take 20 mg by mouth daily before breakfast.     oxybutynin (DITROPAN) 5 MG tablet Take 5 mg by mouth daily as needed for bladder spasms.     pyridOXINE (VITAMIN B-6) 100 MG tablet Take 100 mg by mouth daily.     vitamin B-12 (CYANOCOBALAMIN)  1000 MCG tablet Take 1,000 mcg by mouth daily.       Allergies  Allergen Reactions   Macrodantin [Nitrofurantoin Macrocrystal] Hives   Nsaids Other (See Comments)    History of gastric ulcers   Percocet [Oxycodone-Acetaminophen] Palpitations    Dehydration Dehydration     ROS:   All other systems reviewed & are negative except per HPI or as noted below: Constitutional:  No fevers, chills, sweats.  Weight stable Eyes:  No vision changes, No discharge HENT:  No sore throats, nasal drainage Lymph: No neck swelling, No bruising easily Pulmonary:  No cough, productive sputum CV: No orthopnea, PND  Patient walks 20 minutes without difficulty.  No exertional chest/neck/shoulder/arm pain.  GI: No personal nor family history of GI/colon cancer, inflammatory bowel disease, irritable bowel syndrome, allergy such as Celiac Sprue, dietary/dairy problems, colitis, ulcers nor gastritis.  No recent sick contacts/gastroenteritis.  No travel outside the country.  No changes in diet.  Renal: No UTIs, No hematuria Genital:  No drainage, bleeding, masses Musculoskeletal: No severe joint pain.  Good ROM major joints Skin:  No sores or lesions Heme/Lymph:  No easy bleeding.  No swollen lymph nodes   BP (!) 144/62   Pulse 100   Temp (!) 96.6 F (35.9 C) (Axillary)   Resp 16   Ht  5\' 3"  (1.6 m)   Wt 68.5 kg   SpO2 96%   BMI 26.75 kg/m   Physical Exam:  Constitutional: Not cachectic.  Hygeine adequate.  Vitals signs as above.   Eyes: Pupils reactive, normal extraocular movements. Sclera nonicteric Neuro: CN II-XII intact.  No major focal sensory defects.  No major motor deficits. Lymph: No head/neck/groin lymphadenopathy Psych:  No severe agitation.  No severe anxiety.  Judgment & insight Adequate, Oriented x4, HENT: Normocephalic, Mucus membranes moist.  No thrush.   Neck: Supple, No tracheal deviation.  No obvious thyromegaly Chest: No pain to chest wall compression.  Good respiratory  excursion.  No audible wheezing CV:  Pulses intact.  regular rhythm.  No major extremity edema  Abdomen:  Obese Hernia: Not present. Diastasis recti: Not present. Soft.   Nondistended.  Nontender.  No hepatomegaly.  No splenomegaly  Gen:  Inguinal hernia: Not present.  Inguinal lymph nodes: without lymphadenopathy.    Rectal: (Deferred)  Ext: No obvious deformity or contracture.  Edema: Not present.  No cyanosis Skin: No major subcutaneous nodules.  Warm and dry Musculoskeletal: Severe joint rigidity not present.  No obvious clubbing.  No digital petechiae.     Results:   Labs: Results for orders placed or performed during the hospital encounter of 05/13/23 (from the past 48 hour(s))  Urinalysis, Routine w reflex microscopic -Urine, Clean Catch     Status: Abnormal   Collection Time: 05/13/23 12:45 AM  Result Value Ref Range   Color, Urine YELLOW YELLOW   APPearance CLEAR CLEAR   Specific Gravity, Urine >=1.030 1.005 - 1.030   pH 6.0 5.0 - 8.0   Glucose, UA NEGATIVE NEGATIVE mg/dL   Hgb urine dipstick MODERATE (A) NEGATIVE   Bilirubin Urine NEGATIVE NEGATIVE   Ketones, ur 15 (A) NEGATIVE mg/dL   Protein, ur 696 (A) NEGATIVE mg/dL   Nitrite NEGATIVE NEGATIVE   Leukocytes,Ua NEGATIVE NEGATIVE    Comment: Performed at [facility], [address] [place], [place] [address]  Urinalysis, Microscopic (reflex)     Status: None   Collection Time: 05/13/23 12:45 AM  Result Value Ref Range   RBC / HPF 6-10 0 - 5 RBC/hpf   WBC, UA 0-5 0 - 5 WBC/hpf   Bacteria, UA NONE SEEN NONE SEEN   Squamous Epithelial / HPF 0-5 0 - 5 /HPF   Budding Yeast PRESENT     Comment: Performed at [facility], [address] [place], [place] [address]  Lipase, blood     Status: Abnormal   Collection Time: 05/13/23 10:37 PM  Result Value Ref Range   Lipase 131 (H) 11 - 51 U/L    Comment: Performed at [facility], [address] [place], [place] [address]   Comprehensive metabolic panel     Status: Abnormal   Collection Time: 05/13/23 10:37 PM  Result Value Ref Range   Sodium 137 135 - 145 mmol/L   Potassium 4.1 3.5 - 5.1 mmol/L   Chloride 100 98 - 111 mmol/L   CO2 24 22 - 32 mmol/L   Glucose, Bld 161 (H) 70 - 99 mg/dL    Comment: Glucose reference range applies only to samples taken after fasting for at least 8 hours.   BUN 18 8 - 23 mg/dL   Creatinine, Ser 7.25 0.44 - 1.00 mg/dL   Calcium 9.4 8.9 - 36.6 mg/dL   Total Protein 8.3 (H) 6.5 - 8.1 g/dL  Albumin 4.5 3.5 - 5.0 g/dL   AST 36 15 - 41 U/L   ALT 27 0 - 44 U/L   Alkaline Phosphatase 64 38 - 126 U/L   Total Bilirubin 0.5 0.3 - 1.2 mg/dL   GFR, Estimated >16 >10 mL/min    Comment: (NOTE) Calculated using the CKD-EPI Creatinine Equation (2021)    Anion gap 13 5 - 15    Comment: Performed at [facility], [address] [place], [place] [address]  Troponin I (High Sensitivity)     Status: None   Collection Time: 05/13/23 10:37 PM  Result Value Ref Range   Troponin I (High Sensitivity) 7 <18 ng/L    Comment: (NOTE) Elevated high sensitivity troponin I (hsTnI) values and significant  changes across serial measurements may suggest ACS but many other  chronic and acute conditions are known to elevate hsTnI results.  Refer to the "Links" section for chest pain algorithms and additional  guidance. Performed at [facility], [address] [place], [place] [address]   CBC with Differential     Status: Abnormal   Collection Time: 05/13/23 11:11 PM  Result Value Ref Range   WBC 10.4 4.0 - 10.5 K/uL   RBC 5.30 (H) 3.87 - 5.11 MIL/uL   Hemoglobin 15.4 (H) 12.0 - 15.0 g/dL   HCT 19.1 (H) 47.8 - 29.5 %   MCV 89.2 80.0 - 100.0 fL   MCH 29.1 26.0 - 34.0 pg   MCHC 32.6 30.0 - 36.0 g/dL   RDW 62.1 30.8 - 65.7 %   Platelets 308 150 - 400 K/uL   nRBC 0.0 0.0 - 0.2 %   Neutrophils Relative % 83 %   Neutro Abs 8.7 (H) 1.7 - 7.7 K/uL   Lymphocytes Relative 12  %   Lymphs Abs 1.2 0.7 - 4.0 K/uL   Monocytes Relative 4 %   Monocytes Absolute 0.4 0.1 - 1.0 K/uL   Eosinophils Relative 0 %   Eosinophils Absolute 0.0 0.0 - 0.5 K/uL   Basophils Relative 0 %   Basophils Absolute 0.0 0.0 - 0.1 K/uL   Immature Granulocytes 1 %   Abs Immature Granulocytes 0.05 0.00 - 0.07 K/uL    Comment: Performed at [facility], [address] [place], [place] [address]    Imaging / Studies: DG Abdomen 1 View  Result Date: 05/14/2023 CLINICAL DATA:  Nasogastric tube placement EXAM: ABDOMEN - 1 VIEW COMPARISON:  Abdominal CT from earlier today FINDINGS: An enteric tube reaches the subdiaphragmatic stomach, hiatal hernia is again noted with internal gas. No upper abdominal bowel distension. Clear lung bases in stable heart size. IMPRESSION: Enteric tube with tip and side-port reaching the subdiaphragmatic stomach. Electronically Signed   By: Tiburcio Pea M.D.   On: 05/14/2023 04:26   CT ABDOMEN PELVIS W CONTRAST  Result Date: 05/14/2023 CLINICAL DATA:  Left upper quadrant abdominal pain. EXAM: CT ABDOMEN AND PELVIS WITH CONTRAST TECHNIQUE: Multidetector CT imaging of the abdomen and pelvis was performed using the standard protocol following bolus administration of intravenous contrast. RADIATION DOSE REDUCTION: This exam was performed according to the departmental dose-optimization program which includes automated exposure control, adjustment of the mA and/or kV according to patient size and/or use of iterative reconstruction technique. CONTRAST:  OMNIPAQUE IOHEXOL 300 MG/ML  SOLN COMPARISON:  Abdominal CT dated 12/30/2008. FINDINGS: Lower chest: The visualized lung bases are clear. No intra-abdominal free air or free fluid. Hepatobiliary: Fatty liver.  A 1.6 cm enhancing focus in the caudate lobe (17/2) was present on the prior CT of 12/30/2008, likely portal venous shunting or flash filling hemangioma. No imaging follow-up. No biliary dilatation. The  gallbladder is unremarkable. Pancreas: Unremarkable. No pancreatic ductal dilatation or surrounding inflammatory changes. Spleen: Normal in size without focal abnormality. Adrenals/Urinary Tract: The adrenal glands unremarkable. Small bilateral renal cysts as well as subcentimeter hypodense lesions which are too small to characterize. There is no hydronephrosis on either side. There is symmetric enhancement and excretion of contrast by both kidneys. The visualized ureters and urinary bladder appear unremarkable. Stomach/Bowel: There is a large hiatal hernia containing a portion of the stomach. There is partially visualized mild inflammatory changes surrounding the herniated stomach concerning for a degree of obstruction or gastric volvulus. There is sigmoid diverticulosis without active inflammatory changes. No evidence of small-bowel obstruction. The appendix is normal. Vascular/Lymphatic: Mild aortoiliac atherosclerotic disease. The IVC is unremarkable. No portal venous gas. There is no adenopathy. Reproductive: Hysterectomy.  No adnexal masses. Other: None Musculoskeletal: Degenerative changes of the spine. No acute osseous pathology. IMPRESSION: 1. Large hiatal hernia containing a portion of the stomach with findings concerning for a degree of obstruction or gastric volvulus. 2. Sigmoid diverticulosis. No bowel obstruction. Normal appendix. 3. Fatty liver. 4.  Aortic Atherosclerosis (ICD10-I70.0). Electronically Signed   By: Elgie Collard M.D.   On: 05/14/2023 02:15    Medications / Allergies: per chart  Antibiotics: Anti-infectives (From admission, onward)    None         Note: Portions of this report may have been transcribed using voice recognition software. Every effort was made to ensure accuracy; however, inadvertent computerized transcription errors may be present.   Any transcriptional errors that result from this process are unintentional.    Ardeth Sportsman, MD, FACS,  MASCRS Esophageal, Gastrointestinal & Colorectal Surgery Robotic and Minimally Invasive Surgery  [facility] [address] [place], [place] [address] [phone] Fax [phone] Main  CONTACT INFORMATION: Weekday (9AM-5PM): Call [facility] main office at [phone] Weeknight (5PM-9AM) or Weekend/Holiday: Check EPIC "Web Links" tab & use "AMION" (password " [email or web address]") for General Surgery [facility] coverage  Please, DO NOT use SecureChat  (it is not reliable communication to reach operating surgeons & will lead to a delay in care).   Epic staff messaging available for outptient concerns needing 1-2 business day response.       05/14/2023  7:15 AM

## 2023-05-14 NOTE — ED Notes (Signed)
Attempted to get lactic acid, straight stick attempt missed. IV would not pull back. [facility] arrived for transport

## 2023-05-14 NOTE — ED Notes (Signed)
[facility] called for ED to ED transport @03 :54am

## 2023-05-14 NOTE — ED Notes (Signed)
ED TO INPATIENT HANDOFF REPORT  ED Nurse Name and Phone #:  Seward Grater and Mohammed Kindle Name/Age/Gender Brenda Hunter 88 y.o. female Room/Bed: WA25/WA25  Code Status   Code Status: Full Code  Home/SNF/Other Home Patient oriented to: self, place, time, and situation Is this baseline? Yes   Triage Complete: Triage complete  Chief Complaint Acute gastric volvulus [K31.89]  Triage Note Pt came in via EMS, complains of right upper abdominal pain, and N/V, and headache.Hx of abdominal hernia since she was 88 y/o. States she went out to eat around 1400 and had pain since.    Allergies Allergies  Allergen Reactions   Macrodantin [Nitrofurantoin Macrocrystal] Hives   Nsaids Other (See Comments)    History of gastric ulcers   Oxycodone-Acetaminophen Palpitations    Dehydration  Other Reaction(s): dehydration    Level of Care/Admitting Diagnosis ED Disposition     ED Disposition  Admit   Condition  --   Comment  Hospital Area: [facility] [[id number]]  Level of Care: Med-Surg [16]  May admit patient to [facility] or [facility] if equivalent level of care is available:: Yes  Covid Evaluation: Asymptomatic - no recent exposure (last 10 days) testing not required  Diagnosis: Acute gastric volvulus [846962]  Admitting Physician: Maryln Gottron [[id number]]  Attending Physician: [facility], MIR [email or web address] [[id number]]  Certification:: I certify this patient will need inpatient services for at least 2 midnights  Estimated Length of Stay: 3          B Medical/Surgery History Past Medical History:  Diagnosis Date   Arthritis    KNEES   Complication of anesthesia    MALES LOTS OF URINE AFTER ANESTHESIA   Dysrhythmia    GERD (gastroesophageal reflux disease)    Hematuria    Bladder Spasms (Dr. Logan Bores)   Hiatal hernia    History of rectal polyps    HOH (hard of hearing)    LEFT EAR   Nausea & vomiting 05/14/2023   Peripheral edema    Past Surgical History:   Procedure Laterality Date   BASAL CELL REMOVED FROM LIP  09/2012   COLONSCOPY     CYSTECTOMY     left breats benign   ESOPHAGOGASTRODUODENOSCOPY (EGD) WITH PROPOFOL N/A 05/04/2017   Procedure: ESOPHAGOGASTRODUODENOSCOPY (EGD) WITH PROPOFOL;  Surgeon: Carman Ching, MD;  Location: [facility];  Service: Endoscopy;  Laterality: N/A;   ESOPHAGOGASTRODUODENOSCOPY ENDOSCOPY     HAMMER TOE SURGERY     right foot 2016   KNEE ARTHROSCOPY     right knee   RECTAL POLYPECTOMY     rectal polyps   ROTATOR CUFF REPAIR     right shoulder   SAVORY DILATION N/A 05/04/2017   Procedure: SAVORY DILATION;  Surgeon: Carman Ching, MD;  Location: [facility];  Service: Endoscopy;  Laterality: N/A;   TOTAL ABDOMINAL HYSTERECTOMY     2001     A IV Location/Drains/Wounds Patient Lines/Drains/Airways Status     Active Line/Drains/Airways     Name Placement date Placement time Site Days   Peripheral IV 05/13/23 20 G Anterior;Left;Proximal Forearm 05/13/23  2328  Forearm  1   NG/OG Vented/Dual Lumen 16 Fr. Right nare 70 cm 05/14/23  0417  Right nare  less than 1            Intake/Output Last 24 hours  Intake/Output Summary (Last 24 hours) at 05/14/2023 1111 Last data filed at 05/14/2023 1008 Gross per 24 hour  Intake 2000.04 ml  Output  1 ml  Net 1999.04 ml    Labs/Imaging Results for orders placed or performed during the hospital encounter of 05/13/23 (from the past 48 hour(s))  Urinalysis, Routine w reflex microscopic -Urine, Clean Catch     Status: Abnormal   Collection Time: 05/13/23 12:45 AM  Result Value Ref Range   Color, Urine YELLOW YELLOW   APPearance CLEAR CLEAR   Specific Gravity, Urine >=1.030 1.005 - 1.030   pH 6.0 5.0 - 8.0   Glucose, UA NEGATIVE NEGATIVE mg/dL   Hgb urine dipstick MODERATE (A) NEGATIVE   Bilirubin Urine NEGATIVE NEGATIVE   Ketones, ur 15 (A) NEGATIVE mg/dL   Protein, ur 161 (A) NEGATIVE mg/dL   Nitrite NEGATIVE NEGATIVE   Leukocytes,Ua NEGATIVE  NEGATIVE    Comment: Performed at [facility], [address] [place], [place] [address]  Urinalysis, Microscopic (reflex)     Status: None   Collection Time: 05/13/23 12:45 AM  Result Value Ref Range   RBC / HPF 6-10 0 - 5 RBC/hpf   WBC, UA 0-5 0 - 5 WBC/hpf   Bacteria, UA NONE SEEN NONE SEEN   Squamous Epithelial / HPF 0-5 0 - 5 /HPF   Budding Yeast PRESENT     Comment: Performed at [facility], [address] [place], [place] [address]  Lipase, blood     Status: Abnormal   Collection Time: 05/13/23 10:37 PM  Result Value Ref Range   Lipase 131 (H) 11 - 51 U/L    Comment: Performed at [facility], [address] [place], [place] [address]  Comprehensive metabolic panel     Status: Abnormal   Collection Time: 05/13/23 10:37 PM  Result Value Ref Range   Sodium 137 135 - 145 mmol/L   Potassium 4.1 3.5 - 5.1 mmol/L   Chloride 100 98 - 111 mmol/L   CO2 24 22 - 32 mmol/L   Glucose, Bld 161 (H) 70 - 99 mg/dL    Comment: Glucose reference range applies only to samples taken after fasting for at least 8 hours.   BUN 18 8 - 23 mg/dL   Creatinine, Ser 7.82 0.44 - 1.00 mg/dL   Calcium 9.4 8.9 - 95.6 mg/dL   Total Protein 8.3 (H) 6.5 - 8.1 g/dL   Albumin 4.5 3.5 - 5.0 g/dL   AST 36 15 - 41 U/L   ALT 27 0 - 44 U/L   Alkaline Phosphatase 64 38 - 126 U/L   Total Bilirubin 0.5 0.3 - 1.2 mg/dL   GFR, Estimated >21 >30 mL/min    Comment: (NOTE) Calculated using the CKD-EPI Creatinine Equation (2021)    Anion gap 13 5 - 15    Comment: Performed at [facility], [address] [place], [place] [address]  Troponin I (High Sensitivity)     Status: None   Collection Time: 05/13/23 10:37 PM  Result Value Ref Range   Troponin I (High Sensitivity) 7 <18 ng/L    Comment: (NOTE) Elevated high sensitivity troponin I (hsTnI) values and significant  changes across serial measurements may suggest ACS but many other  chronic and acute conditions are  known to elevate hsTnI results.  Refer to the "Links" section for chest pain algorithms and additional  guidance. Performed at [facility], [address], [place], [place] [address]   CBC with Differential     Status: Abnormal   Collection Time: 05/13/23 11:11 PM  Result Value Ref Range   WBC 10.4 4.0 - 10.5 K/uL   RBC 5.30 (H) 3.87 - 5.11 MIL/uL   Hemoglobin 15.4 (H) 12.0 - 15.0 g/dL   HCT 16.1 (H) 09.6 - 04.5 %   MCV 89.2 80.0 - 100.0 fL   MCH 29.1 26.0 - 34.0 pg   MCHC 32.6 30.0 - 36.0 g/dL   RDW 40.9 81.1 - 91.4 %   Platelets 308 150 - 400 K/uL   nRBC 0.0 0.0 - 0.2 %   Neutrophils Relative % 83 %   Neutro Abs 8.7 (H) 1.7 - 7.7 K/uL   Lymphocytes Relative 12 %   Lymphs Abs 1.2 0.7 - 4.0 K/uL   Monocytes Relative 4 %   Monocytes Absolute 0.4 0.1 - 1.0 K/uL   Eosinophils Relative 0 %   Eosinophils Absolute 0.0 0.0 - 0.5 K/uL   Basophils Relative 0 %   Basophils Absolute 0.0 0.0 - 0.1 K/uL   Immature Granulocytes 1 %   Abs Immature Granulocytes 0.05 0.00 - 0.07 K/uL    Comment: Performed at [facility], [address] [place], [place] [address]   DG Abdomen 1 View  Result Date: 05/14/2023 CLINICAL DATA:  Nasogastric tube placement EXAM: ABDOMEN - 1 VIEW COMPARISON:  Abdominal CT from earlier today FINDINGS: An enteric tube reaches the subdiaphragmatic stomach, hiatal hernia is again noted with internal gas. No upper abdominal bowel distension. Clear lung bases in stable heart size. IMPRESSION: Enteric tube with tip and side-port reaching the subdiaphragmatic stomach. Electronically Signed   By: Tiburcio Pea M.D.   On: 05/14/2023 04:26   CT ABDOMEN PELVIS W CONTRAST  Result Date: 05/14/2023 CLINICAL DATA:  Left upper quadrant abdominal pain. EXAM: CT ABDOMEN AND PELVIS WITH CONTRAST TECHNIQUE: Multidetector CT imaging of the abdomen and pelvis was performed using the standard protocol following bolus administration of intravenous contrast. RADIATION  DOSE REDUCTION: This exam was performed according to the departmental dose-optimization program which includes automated exposure control, adjustment of the mA and/or kV according to patient size and/or use of iterative reconstruction technique. CONTRAST:  OMNIPAQUE IOHEXOL 300 MG/ML  SOLN COMPARISON:  Abdominal CT dated 12/30/2008. FINDINGS: Lower chest: The visualized lung bases are clear. No intra-abdominal free air or free fluid. Hepatobiliary: Fatty liver. A 1.6 cm enhancing focus in the caudate lobe (17/2) was present on the prior CT of 12/30/2008, likely portal venous shunting or flash filling hemangioma. No imaging follow-up. No biliary dilatation. The gallbladder is unremarkable. Pancreas: Unremarkable. No pancreatic ductal dilatation or surrounding inflammatory changes. Spleen: Normal in size without focal abnormality. Adrenals/Urinary Tract: The adrenal glands unremarkable. Small bilateral renal cysts as well as subcentimeter hypodense lesions which are too small to characterize. There is no hydronephrosis on either side. There is symmetric enhancement and excretion of contrast by both kidneys. The visualized ureters and urinary bladder appear unremarkable. Stomach/Bowel: There is a large hiatal hernia containing a portion of the stomach. There is partially visualized mild inflammatory changes surrounding the herniated stomach concerning for a degree of obstruction or gastric volvulus. There is sigmoid diverticulosis without active inflammatory changes. No evidence of small-bowel obstruction. The appendix is normal. Vascular/Lymphatic: Mild aortoiliac atherosclerotic disease. The IVC is unremarkable. No portal venous gas. There is no adenopathy. Reproductive: Hysterectomy.  No adnexal masses. Other: None Musculoskeletal: Degenerative changes of the spine. No acute osseous pathology. IMPRESSION: 1. Large hiatal hernia containing a portion of the stomach with findings concerning for a degree of  obstruction or gastric volvulus. 2. Sigmoid diverticulosis. No bowel obstruction. Normal appendix. 3. Fatty liver. 4.  Aortic Atherosclerosis (ICD10-I70.0). Electronically Signed   By: Elgie Collard M.D.   On: 05/14/2023 02:15    Pending Labs Unresulted Labs (From admission, onward)     Start     Ordered   05/16/23 0000  Comprehensive metabolic panel  Weekly,   R      05/14/23 0724   05/16/23 0000  Prealbumin  Weekly,   R      05/14/23 0724   05/15/23 0500  CBC  Tomorrow morning,   R        05/14/23 0710   05/15/23 0500  Basic metabolic panel  Tomorrow morning,   R        05/14/23 0710   05/14/23 0725  Urinalysis, Complete w Microscopic -Urine, Clean Catch  Once,   URGENT       Question:  Specimen Source  Answer:  Urine, Clean Catch   05/14/23 0724   05/14/23 0720  H. pylori antigen, stool  Once,   URGENT        05/14/23 0719            Vitals/Pain Today's Vitals   05/14/23 0900 05/14/23 0917 05/14/23 0930 05/14/23 1000  BP: (!) 142/60  (!) 137/52 (!) 143/62  Pulse: 95  94 94  Resp: 11  12 11   Temp:  97.9 F (36.6 C)    TempSrc:  Oral    SpO2: 92%  92% 92%  Weight:      Height:      PainSc:        Isolation Precautions No active isolations  Medications Medications  lactated ringers bolus 1,000 mL (has no administration in time range)  methocarbamol (ROBAXIN) 1,000 mg in dextrose 5 % 100 mL IVPB (has no administration in time range)  acetaminophen (TYLENOL) suppository 650 mg (has no administration in time range)  ondansetron (ZOFRAN) injection 4 mg (4 mg Intravenous Given 05/14/23 0746)    Or  ondansetron (ZOFRAN) 8 mg in sodium chloride 0.9 % 50 mL IVPB ( Intravenous See Alternative 05/14/23 0746)  prochlorperazine (COMPAZINE) injection 5-10 mg (has no administration in time range)  metoCLOPramide (REGLAN) injection 5-10 mg (has no administration in time range)  phenol (CHLORASEPTIC) mouth spray 2 spray (has no administration in time range)   menthol-cetylpyridinium (CEPACOL) lozenge 3 mg (has no administration in time range)  magic mouthwash (has no administration in time range)  alum & mag hydroxide-simeth (MAALOX/MYLANTA) 200-200-20 MG/5ML suspension 30 mL (has no administration in time range)  simethicone (MYLICON) 40 MG/0.6ML suspension 80 mg (has no administration in time range)  bisacodyl (DULCOLAX) suppository 10 mg (has no administration in time range)  diphenhydrAMINE (BENADRYL) injection 12.5-25 mg (has no administration in time range)  metoprolol tartrate (LOPRESSOR) injection 5 mg (has no administration in time range)  pantoprazole (PROTONIX) injection 40 mg (40 mg Intravenous Given 05/14/23 0914)  fentaNYL (SUBLIMAZE) injection 12.5-25 mcg (has no administration in time range)  enoxaparin (LOVENOX) injection 40 mg (40 mg Subcutaneous Given 05/14/23 1005)  0.9 %  sodium chloride infusion ( Intravenous New Bag/Given 05/14/23 1008)  albuterol (PROVENTIL) (2.5 MG/3ML) 0.083% nebulizer solution 2.5 mg (has no administration in time range)  morphine (PF) 2 MG/ML injection 2 mg (has no administration in time range)  ondansetron (ZOFRAN) injection 4 mg (4 mg Intravenous Given 05/13/23 2331)  morphine (PF) 4 MG/ML injection 4 mg (4 mg Intravenous Given 05/13/23 2331)  sodium chloride 0.9 % bolus 1,000 mL (0 mLs Intravenous Stopped 05/14/23 0101)  morphine (PF) 4 MG/ML injection 4 mg (4 mg Intravenous Given 05/14/23 0041)  HYDROmorphone (DILAUDID) injection 0.5 mg (0.5 mg Intravenous Given 05/14/23 0100)  iohexol (OMNIPAQUE) 300 MG/ML solution 100 mL (100 mLs Intravenous Contrast Given 05/14/23 0153)  HYDROmorphone (DILAUDID) injection 1 mg (1 mg Intravenous Given 05/14/23 0314)  prochlorperazine (COMPAZINE) injection 10 mg (10 mg Intravenous Given 05/14/23 0301)  lactated ringers bolus 1,000 mL (0 mLs Intravenous Stopped 05/14/23 1008)    Mobility walks     Focused Assessments GI   R Recommendations: See Admitting Provider  Note  Report given to:   Additional Notes:  Ng tube

## 2023-05-14 NOTE — Progress Notes (Signed)
Mobility Specialist - Progress Note   05/14/23 1514  Mobility  Activity Ambulated with assistance in hallway  Level of Assistance Standby assist, set-up cues, supervision of patient - no hands on  Assistive Device Other (Comment) (IV Pole)  Distance Ambulated (ft) 250 ft  Activity Response Tolerated well  Mobility Referral Yes  $Mobility charge 1 Mobility  Mobility Specialist Start Time (ACUTE ONLY) 0303  Mobility Specialist Stop Time (ACUTE ONLY) [id number]  Mobility Specialist Time Calculation (min) (ACUTE ONLY) 9 min   Pt received in bed and agreeable to mobility. No complaints during session. Pt to bed after session with all needs met.    [facility]

## 2023-05-14 NOTE — ED Notes (Signed)
Pt offered bedside commode, as the primary nurse was going to get the commode, pt decided to walk to bathroom on her own.

## 2023-05-14 NOTE — H&P (Signed)
History and Physical  Brenda Hunter ZOX:[id number] DOB: 08-06-1935 DOA: 05/13/2023  PCP: Daisy Floro, MD   Chief Complaint: Abdominal pain, nausea  HPI: Brenda Hunter is a 88 y.o. female with medical history significant for hypertension, GERD, esophageal stricture and hiatal hernia being admitted to the hospital with incarcerated hiatal hernia.  Patient tells me that she has intermittent dysphagia, she also intermittently gets abdominal pain and nausea which lasts for a few minutes, however yesterday after eating lunch she had uncontrolled nausea and vomiting and lots of abdominal cramping which did not get better.  She could not keep anything down, and came to the emergency department for evaluation.  Denies any significant chest pain, cough, shortness of breath.  ED Course: On evaluation in the emergency department, she has been afebrile, she has been tachycardic and somewhat hypertensive though that is improved after pain medications.  She has been saturating well on room air.  Lab work is relatively unremarkable including CBC and CMP.  Troponin is 7.  CT scan was done, as noted below in detail, with evidence of large hiatal hernia, possible gastric obstruction or volvulus.  NG tube was placed in the emergency department, and patient is feeling much better.  She currently denies any nausea or pain.  She was transferred ED to ED, and hospitalist was contacted for admission after she had surgical evaluation in the emergency department.  Review of Systems: Please see HPI for pertinent positives and negatives. A complete 10 system review of systems are otherwise negative.  Past Medical History:  Diagnosis Date   Arthritis    KNEES   Complication of anesthesia    MALES LOTS OF URINE AFTER ANESTHESIA   Dysrhythmia    GERD (gastroesophageal reflux disease)    Hematuria    Bladder Spasms (Dr. Logan Bores)   Hiatal hernia    History of rectal polyps    HOH (hard of hearing)    LEFT EAR    Nausea & vomiting 05/14/2023   Peripheral edema    Past Surgical History:  Procedure Laterality Date   BASAL CELL REMOVED FROM LIP  09/2012   COLONSCOPY     CYSTECTOMY     left breats benign   ESOPHAGOGASTRODUODENOSCOPY (EGD) WITH PROPOFOL N/A 05/04/2017   Procedure: ESOPHAGOGASTRODUODENOSCOPY (EGD) WITH PROPOFOL;  Surgeon: Carman Ching, MD;  Location: [facility];  Service: Endoscopy;  Laterality: N/A;   ESOPHAGOGASTRODUODENOSCOPY ENDOSCOPY     HAMMER TOE SURGERY     right foot 2016   KNEE ARTHROSCOPY     right knee   RECTAL POLYPECTOMY     rectal polyps   ROTATOR CUFF REPAIR     right shoulder   SAVORY DILATION N/A 05/04/2017   Procedure: SAVORY DILATION;  Surgeon: Carman Ching, MD;  Location: [facility];  Service: Endoscopy;  Laterality: N/A;   TOTAL ABDOMINAL HYSTERECTOMY     2001    Social History:  reports that she has never smoked. She has never used smokeless tobacco. She reports that she does not drink alcohol and does not use drugs.   Allergies  Allergen Reactions   Macrodantin [Nitrofurantoin Macrocrystal] Hives   Nsaids Other (See Comments)    History of gastric ulcers   Oxycodone-Acetaminophen Palpitations    Dehydration  Other Reaction(s): dehydration    Family History  Problem Relation Age of Onset   Heart attack Mother    Heart disease Mother    Prostate cancer Father    Heart Problems Father  Kidney failure Sister    Renal Disease Sister    Lung cancer Sister    Diverticulitis Sister      Prior to Admission medications   Medication Sig Start Date End Date Taking? Authorizing Provider  methenamine (HIPREX) 1 g tablet Take 1 g by mouth 2 (two) times daily. 05/17/17  Yes [provider]  metoprolol succinate (TOPROL-XL) 25 MG 24 hr tablet TAKE ONE (1) TABLET BY MOUTH EACH DAY 01/20/21  Yes Jake Bathe, MD  omeprazole (PRILOSEC OTC) 20 MG tablet Take 20 mg by mouth daily before breakfast.   Yes [provider]  oxybutynin  (DITROPAN) 5 MG tablet Take 5 mg by mouth daily as needed for bladder spasms.   Yes [provider]  pyridOXINE (VITAMIN B-6) 100 MG tablet Take 100 mg by mouth daily.   Yes [provider]  vitamin B-12 (CYANOCOBALAMIN) 1000 MCG tablet Take 1,000 mcg by mouth daily.   Yes [provider]    Physical Exam: BP (!) 142/60   Pulse 95   Temp 97.9 F (36.6 C) (Oral)   Resp 11   Ht 5\' 3"  (1.6 m)   Wt 68.5 kg   SpO2 92%   BMI 26.75 kg/m   General:  Alert, oriented, calm, in no acute distress, lying on the stretcher in the emergency department, looks comfortable, has NG tube in place connected to suction. Eyes: EOMI, clear conjuctivae, white sclerea Neck: supple, no masses, trachea mildline  Cardiovascular: RRR, no murmurs or rubs, no peripheral edema  Respiratory: clear to auscultation bilaterally, no wheezes, no crackles  Abdomen: soft, nontender, nondistended, normal bowel tones heard  Skin: dry, no rashes  Musculoskeletal: no joint effusions, normal range of motion  Psychiatric: appropriate affect, normal speech  Neurologic: extraocular muscles intact, clear speech, moving all extremities with intact sensorium          Labs on Admission:  Basic Metabolic Panel: Recent Labs  Lab 05/13/23 2237  NA 137  K 4.1  CL 100  CO2 24  GLUCOSE 161*  BUN 18  CREATININE 0.61  CALCIUM 9.4   Liver Function Tests: Recent Labs  Lab 05/13/23 2237  AST 36  ALT 27  ALKPHOS 64  BILITOT 0.5  PROT 8.3*  ALBUMIN 4.5   Recent Labs  Lab 05/13/23 2237  LIPASE 131*   No results for input(s): "AMMONIA" in the last 168 hours. CBC: Recent Labs  Lab 05/13/23 2311  WBC 10.4  NEUTROABS 8.7*  HGB 15.4*  HCT 47.3*  MCV 89.2  PLT 308   Cardiac Enzymes: No results for input(s): "CKTOTAL", "CKMB", "CKMBINDEX", "TROPONINI" in the last 168 hours.  BNP (last 3 results) No results for input(s): "BNP" in the last 8760 hours.  ProBNP (last 3 results) No results  for input(s): "PROBNP" in the last 8760 hours.  CBG: No results for input(s): "GLUCAP" in the last 168 hours.  Radiological Exams on Admission: DG Abdomen 1 View  Result Date: 05/14/2023 CLINICAL DATA:  Nasogastric tube placement EXAM: ABDOMEN - 1 VIEW COMPARISON:  Abdominal CT from earlier today FINDINGS: An enteric tube reaches the subdiaphragmatic stomach, hiatal hernia is again noted with internal gas. No upper abdominal bowel distension. Clear lung bases in stable heart size. IMPRESSION: Enteric tube with tip and side-port reaching the subdiaphragmatic stomach. Electronically Signed   By: Tiburcio Pea M.D.   On: 05/14/2023 04:26   CT ABDOMEN PELVIS W CONTRAST  Result Date: 05/14/2023 CLINICAL DATA:  Left upper quadrant  abdominal pain. EXAM: CT ABDOMEN AND PELVIS WITH CONTRAST TECHNIQUE: Multidetector CT imaging of the abdomen and pelvis was performed using the standard protocol following bolus administration of intravenous contrast. RADIATION DOSE REDUCTION: This exam was performed according to the departmental dose-optimization program which includes automated exposure control, adjustment of the mA and/or kV according to patient size and/or use of iterative reconstruction technique. CONTRAST:  OMNIPAQUE IOHEXOL 300 MG/ML  SOLN COMPARISON:  Abdominal CT dated 12/30/2008. FINDINGS: Lower chest: The visualized lung bases are clear. No intra-abdominal free air or free fluid. Hepatobiliary: Fatty liver. A 1.6 cm enhancing focus in the caudate lobe (17/2) was present on the prior CT of 12/30/2008, likely portal venous shunting or flash filling hemangioma. No imaging follow-up. No biliary dilatation. The gallbladder is unremarkable. Pancreas: Unremarkable. No pancreatic ductal dilatation or surrounding inflammatory changes. Spleen: Normal in size without focal abnormality. Adrenals/Urinary Tract: The adrenal glands unremarkable. Small bilateral renal cysts as well as subcentimeter hypodense  lesions which are too small to characterize. There is no hydronephrosis on either side. There is symmetric enhancement and excretion of contrast by both kidneys. The visualized ureters and urinary bladder appear unremarkable. Stomach/Bowel: There is a large hiatal hernia containing a portion of the stomach. There is partially visualized mild inflammatory changes surrounding the herniated stomach concerning for a degree of obstruction or gastric volvulus. There is sigmoid diverticulosis without active inflammatory changes. No evidence of small-bowel obstruction. The appendix is normal. Vascular/Lymphatic: Mild aortoiliac atherosclerotic disease. The IVC is unremarkable. No portal venous gas. There is no adenopathy. Reproductive: Hysterectomy.  No adnexal masses. Other: None Musculoskeletal: Degenerative changes of the spine. No acute osseous pathology. IMPRESSION: 1. Large hiatal hernia containing a portion of the stomach with findings concerning for a degree of obstruction or gastric volvulus. 2. Sigmoid diverticulosis. No bowel obstruction. Normal appendix. 3. Fatty liver. 4.  Aortic Atherosclerosis (ICD10-I70.0). Electronically Signed   By: Elgie Collard M.D.   On: 05/14/2023 02:15    Assessment/Plan This is a pleasant and active 88 year old female with a known history of esophageal stricture, acid reflux, palpitations on metoprolol being admitted to the hospital with incarcerated hiatal hernia.  Incarcerated hiatal hernia-she has been evaluated by Dr. Michaell Cowing in the emergency department, he does not plan any urgent surgical intervention.  She may need surgical repair during this admission, or elective hernia repair if obstructive symptoms resolve during this hospital stay. -Inpatient admission -IV fluids -Pain and nausea control -Continue NG tube to low intermittent suction  Palpitations and hypertension-patient is on Toprol-XL at home, currently n.p.o. due to concerns for incarcerated hiatal  hernia. -IV metoprolol 5 mg every 6 hours as needed hypertension or tachycardia  GERD/history of esophageal stricture-IV PPI twice daily  DVT prophylaxis: Lovenox     Code Status: Full Code  Consults called: Dr. Michaell Cowing General Surgery has evaluated in the ER.  Admission status: The appropriate patient status for this patient is INPATIENT. Inpatient status is judged to be reasonable and necessary in order to provide the required intensity of service to ensure the patient's safety. The patient's presenting symptoms, physical exam findings, and initial radiographic and laboratory data in the context of their chronic comorbidities is felt to place them at high risk for further clinical deterioration. Furthermore, it is not anticipated that the patient will be medically stable for discharge from the hospital within 2 midnights of admission.    I certify that at the point of admission it is my clinical judgment that the patient will  require inpatient hospital care spanning beyond 2 midnights from the point of admission due to high intensity of service, high risk for further deterioration and high frequency of surveillance required  Time spent: 56 minutes  Dywane Peruski Sharlette Dense MD [facility] Pager [phone]  If 7PM-7AM, please contact night-coverage [email or web address] Password [email or web address]  05/14/2023, 9:37 AM

## 2023-05-15 DIAGNOSIS — K311 Adult hypertrophic pyloric stenosis: Secondary | ICD-10-CM | POA: Diagnosis not present

## 2023-05-15 DIAGNOSIS — K449 Diaphragmatic hernia without obstruction or gangrene: Secondary | ICD-10-CM | POA: Diagnosis not present

## 2023-05-15 DIAGNOSIS — K222 Esophageal obstruction: Secondary | ICD-10-CM

## 2023-05-15 DIAGNOSIS — D509 Iron deficiency anemia, unspecified: Secondary | ICD-10-CM | POA: Insufficient documentation

## 2023-05-15 DIAGNOSIS — R1013 Epigastric pain: Secondary | ICD-10-CM | POA: Diagnosis not present

## 2023-05-15 LAB — CBC
HCT: 39.7 % (ref 36.0–46.0)
Hemoglobin: 12.2 g/dL (ref 12.0–15.0)
MCH: 28.6 pg (ref 26.0–34.0)
MCHC: 30.7 g/dL (ref 30.0–36.0)
MCV: 93.2 fL (ref 80.0–100.0)
Platelets: 228 10*3/uL (ref 150–400)
RBC: 4.26 MIL/uL (ref 3.87–5.11)
RDW: 14 % (ref 11.5–15.5)
WBC: 11.7 10*3/uL — ABNORMAL HIGH (ref 4.0–10.5)
nRBC: 0 % (ref 0.0–0.2)

## 2023-05-15 LAB — IRON AND TIBC
Iron: 26 ug/dL — ABNORMAL LOW (ref 28–170)
Saturation Ratios: 10 % — ABNORMAL LOW (ref 10.4–31.8)
TIBC: 272 ug/dL (ref 250–450)
UIBC: 246 ug/dL

## 2023-05-15 LAB — BASIC METABOLIC PANEL
Anion gap: 5 (ref 5–15)
BUN: 15 mg/dL (ref 8–23)
CO2: 26 mmol/L (ref 22–32)
Calcium: 8.4 mg/dL — ABNORMAL LOW (ref 8.9–10.3)
Chloride: 108 mmol/L (ref 98–111)
Creatinine, Ser: 0.6 mg/dL (ref 0.44–1.00)
GFR, Estimated: >60 mL/min (ref >60)
Glucose, Bld: 106 mg/dL — ABNORMAL HIGH (ref 70–99)
Potassium: 3.7 mmol/L (ref 3.5–5.1)
Sodium: 139 mmol/L (ref 135–145)

## 2023-05-15 LAB — HEMOGLOBIN A1C
Hgb A1c MFr Bld: 5.9 % — ABNORMAL HIGH (ref 4.8–5.6)
Mean Plasma Glucose: 122.63 mg/dL

## 2023-05-15 LAB — RETICULOCYTES
Immature Retic Fract: 9.6 % (ref 2.3–15.9)
RBC.: 4.17 MIL/uL (ref 3.87–5.11)
Retic Count, Absolute: 59.2 10*3/uL (ref 19.0–186.0)
Retic Ct Pct: 1.4 % (ref 0.4–3.1)

## 2023-05-15 LAB — FOLATE: Folate: 9.4 ng/mL (ref >5.9)

## 2023-05-15 LAB — FERRITIN: Ferritin: 24 ng/mL (ref 11–307)

## 2023-05-15 LAB — VITAMIN B12: Vitamin B-12: 1357 pg/mL — ABNORMAL HIGH (ref 180–914)

## 2023-05-15 MED ORDER — ENSURE PRE-SURGERY PO LIQD: 592.0000 mL | Freq: Once | ORAL | Status: DC

## 2023-05-15 MED ORDER — CHLORHEXIDINE GLUCONATE CLOTH 2 % EX PADS: 6.0000 | MEDICATED_PAD | Freq: Once | CUTANEOUS | Status: DC

## 2023-05-15 MED ORDER — DEXAMETHASONE SODIUM PHOSPHATE 4 MG/ML IJ SOLN: 4.0000 mg | INTRAMUSCULAR | Status: DC

## 2023-05-15 MED ORDER — FAMOTIDINE IN NACL 20-0.9 MG/50ML-% IV SOLN
20.0000 mg | Freq: Two times a day (BID) | INTRAVENOUS | Status: DC
  Administered 2023-05-15 – 2023-05-18 (×7): 20 mg via INTRAVENOUS
  Filled 2023-05-15 (×7): qty 50

## 2023-05-15 MED ORDER — ENSURE PRE-SURGERY PO LIQD: 296.0000 mL | Freq: Once | ORAL | Status: DC

## 2023-05-15 MED ORDER — METOPROLOL TARTRATE 5 MG/5ML IV SOLN
2.5000 mg | Freq: Two times a day (BID) | INTRAVENOUS | Status: DC
  Administered 2023-05-15 – 2023-05-17 (×5): 2.5 mg via INTRAVENOUS
  Filled 2023-05-15 (×6): qty 5

## 2023-05-15 MED ORDER — SODIUM CHLORIDE 0.9 % IV SOLN
2.0000 g | INTRAVENOUS | Status: AC
  Administered 2023-05-17: 2 g via INTRAVENOUS
  Filled 2023-05-15: qty 20

## 2023-05-15 MED ORDER — BUPIVACAINE LIPOSOME 1.3 % IJ SUSP: 20.0000 mL | Freq: Once | INTRAMUSCULAR | Status: DC

## 2023-05-15 MED ORDER — SCOPOLAMINE 1 MG/3DAYS TD PT72
1.0000 | MEDICATED_PATCH | TRANSDERMAL | Status: DC
  Administered 2023-05-17: 1.5 mg via TRANSDERMAL
  Filled 2023-05-15: qty 1

## 2023-05-15 NOTE — Progress Notes (Signed)
05/15/2023  Brenda Hunter [id number] 1934/11/01  CARE TEAM: PCP: Daisy Floro, MD  Outpatient Care Team: Patient Care Team: Daisy Floro, MD as PCP - General (Family Medicine) Jake Bathe, MD as PCP - Cardiology (Cardiology) Logan Bores Marveen Reeks, MD (Urology) Kathi Der, MD as Consulting Physician (Gastroenterology)  Inpatient Treatment Team: Treatment Team:  [name], MD [facility], Md, MD Kathi Der, MD Lilyan Gilford, MD Catalina Lunger, RN Misty Stanley, NT Redmond Baseman, RN Bess Kinds, RN Lucia Gaskins, [facility]   Problem List:   Principal Problem:   Gastric outflow obstruction Active Problems:   Essential hypertension   LAFB (left anterior fascicular block)   Incarcerated hiatal hernia   Nausea & vomiting   Esophageal stricture   HOH (hard of hearing)   Hiatal hernia   GERD (gastroesophageal reflux disease)   Dysrhythmia   Arthritis   Peripheral edema   History of cameron gastric ulcer   Overweight   IDA (iron deficiency anemia)   * No surgery found *      Assessment [facility] Stay = 1 days)      Chronically incarcerated hernia with concerns of gastric outlet obstruction slowly improving    Plan:  Given her history of worsening dysphagia now with obstruction, I think it is just a matter of time before she needs surgery to reduce her hiatal hernia.  Would recommend minimally invasive robotic approach.  Most likely reduce, consider mesh reinforcement.  Partial fundoplication given advanced age and inability to get manometry preop.  The anatomy & physiology of the foregut and anti-reflux mechanism was discussed.  The pathophysiology of hiatal herniation and GERD was discussed.  Natural history risks without surgery was discussed.   The patient's symptoms are not adequately controlled by medicines and other non-operative treatments.  I feel the risks of no intervention will lead to serious problems that outweigh the  operative risks; therefore, I recommended surgery to reduce the hiatal hernia out of the chest and fundoplication to rebuild the anti-reflux valve and control reflux better.  Need for a thorough workup to rule out the differential diagnosis and plan treatment was explained.  I explained minimally invasive techniques with possible need for an open approach.  Risks such as bleeding, infection, abscess, leak,injury to other organs, need for repair of tissues / organs, need for further treatment, stroke, heart attack, death, and other risks were discussed.   I noted a good likelihood this will help address the problem.  Goals of post-operative recovery were discussed as well.  Possibility that this will not correct all symptoms was explained.  Post-operative dysphagia, need for short-term liquid & pureed diet, inability to vomit, possibility of reherniation, possible need for medicines to help control symptoms in addition to surgery were discussed.  We will work to minimize complications.   Educational handouts further explaining the pathology, treatment options, and dysphagia diet was given as well.  Questions were answered.  The patient family express understanding & wish to proceed with surgery.  Patient is much more open to the idea now that she is talk to family and thought about things.  She really wants me to be the surgeon.  I will try and see if I can get time Tuesday afternoon which is my only time available this week before I am out of town.  Otherwise it may need to be one of my partners that can do this.  -Patient feeling much better overall.  Will do  NG tube clamping trial.  If tolerates liquids with low residuals, can consider removing NG tube and transitioning to pured diet and potentially discharge in a few days with close outpatient follow-up and hiatal hernia repair.  If she fails clamping trial then try and see if I can do this Tuesday afternoon.  If not me, then someone in our group with hiatal  hernia surgical advanced laparoscopic/robotic/minimally invasive experience.  -Aggressive PPI. -VTE prophylaxis- SCDs, etc -mobilize as tolerated to help recovery       I reviewed nursing notes, hospitalist notes, last 24 h vitals and pain scores, last 48 h intake and output, last 24 h labs and trends, and last 24 h imaging results.  I have reviewed this patient's available data, including medical history, events of note, test results, etc as part of my evaluation.   A significant portion of that time was spent in counseling. Care during the described time interval was provided by me.  This care required moderate level of medical decision making.  05/15/2023    Subjective: (Chief complaint)  Patient feeling better overall.  No more retching.  Thinks she is passing gas.  Husband daughter and son-in-law in room.  They had many questions about surgery and hiatal hernia pathophysiology.  Objective:  Vital signs:  Vitals:   05/14/23 1757 05/14/23 2031 05/15/23 0036 05/15/23 0408  BP: (!) 152/74 134/71 135/69 (!) 127/54  Pulse: 94 90 95 91  Resp:  18 18 18   Temp: 98.4 F (36.9 C) 98.5 F (36.9 C) 97.8 F (36.6 C) 99 F (37.2 C)  TempSrc: Oral Oral Oral Oral  SpO2: 92% 92% 94% 94%  Weight:      Height:        Last BM Date : 05/13/23  Intake/Output   Yesterday:  07/20 0701 - 07/21 0700 In: 1804.2 [I.V.:804.2; IV Piggyback:1000] Out: 651 [Urine:501; Emesis/NG output:150] This shift:  Total I/O In: 0  Out: 100 [Urine:100]  Bowel function:  Flatus: YES  BM:  No  Drain: NG tube with scant serous none bilious effluent   Physical Exam:  General: Pt awake/alert in no acute distress Eyes: PERRL, normal EOM.  Sclera clear.  No icterus Neuro: CN II-XII intact w/o focal sensory/motor deficits. Lymph: No head/neck/groin lymphadenopathy Psych:  No delerium/psychosis/paranoia.  Oriented x 4 HENT: Normocephalic, Mucus membranes moist.  No thrush Neck: Supple,  No tracheal deviation.  No obvious thyromegaly Chest: No pain to chest wall compression.  Good respiratory excursion.  No audible wheezing CV:  Pulses intact.  Regular rhythm.  No major extremity edema MS: Normal AROM mjr joints.  No obvious deformity  Abdomen: Soft.  Nondistended.  Nontender.  No evidence of peritonitis.  No incarcerated hernias.  Ext:   No deformity.  No mjr edema.  No cyanosis Skin: No petechiae / purpurea.  No major sores.  Warm and dry    Results:   Cultures: No results found for this or any previous visit (from the past 720 hour(s)).  Labs: Results for orders placed or performed during the hospital encounter of 05/13/23 (from the past 48 hour(s))  Lipase, blood     Status: Abnormal   Collection Time: 05/13/23 10:37 PM  Result Value Ref Range   Lipase 131 (H) 11 - 51 U/L    Comment: Performed at [facility], [address], [place], [place] [address]  Comprehensive metabolic panel     Status: Abnormal   Collection Time: 05/13/23 10:37 PM  Result  Value Ref Range   Sodium 137 135 - 145 mmol/L   Potassium 4.1 3.5 - 5.1 mmol/L   Chloride 100 98 - 111 mmol/L   CO2 24 22 - 32 mmol/L   Glucose, Bld 161 (H) 70 - 99 mg/dL    Comment: Glucose reference range applies only to samples taken after fasting for at least 8 hours.   BUN 18 8 - 23 mg/dL   Creatinine, Ser 7.82 0.44 - 1.00 mg/dL   Calcium 9.4 8.9 - 95.6 mg/dL   Total Protein 8.3 (H) 6.5 - 8.1 g/dL   Albumin 4.5 3.5 - 5.0 g/dL   AST 36 15 - 41 U/L   ALT 27 0 - 44 U/L   Alkaline Phosphatase 64 38 - 126 U/L   Total Bilirubin 0.5 0.3 - 1.2 mg/dL   GFR, Estimated >21 >30 mL/min    Comment: (NOTE) Calculated using the CKD-EPI Creatinine Equation (2021)    Anion gap 13 5 - 15    Comment: Performed at [facility], [address] [place], [place] [address]  Troponin I (High Sensitivity)     Status: None   Collection Time: 05/13/23 10:37 PM  Result Value Ref Range   Troponin I (High  Sensitivity) 7 <18 ng/L    Comment: (NOTE) Elevated high sensitivity troponin I (hsTnI) values and significant  changes across serial measurements may suggest ACS but many other  chronic and acute conditions are known to elevate hsTnI results.  Refer to the "Links" section for chest pain algorithms and additional  guidance. Performed at [facility], [address] [place], [place] [address]   CBC with Differential     Status: Abnormal   Collection Time: 05/13/23 11:11 PM  Result Value Ref Range   WBC 10.4 4.0 - 10.5 K/uL   RBC 5.30 (H) 3.87 - 5.11 MIL/uL   Hemoglobin 15.4 (H) 12.0 - 15.0 g/dL   HCT 95.2 (H) 84.1 - 32.4 %   MCV 89.2 80.0 - 100.0 fL   MCH 29.1 26.0 - 34.0 pg   MCHC 32.6 30.0 - 36.0 g/dL   RDW 40.1 02.7 - 25.3 %   Platelets 308 150 - 400 K/uL   nRBC 0.0 0.0 - 0.2 %   Neutrophils Relative % 83 %   Neutro Abs 8.7 (H) 1.7 - 7.7 K/uL   Lymphocytes Relative 12 %   Lymphs Abs 1.2 0.7 - 4.0 K/uL   Monocytes Relative 4 %   Monocytes Absolute 0.4 0.1 - 1.0 K/uL   Eosinophils Relative 0 %   Eosinophils Absolute 0.0 0.0 - 0.5 K/uL   Basophils Relative 0 %   Basophils Absolute 0.0 0.0 - 0.1 K/uL   Immature Granulocytes 1 %   Abs Immature Granulocytes 0.05 0.00 - 0.07 K/uL    Comment: Performed at [facility], [address] [place], [place] [address]  Urinalysis, Complete w Microscopic -Urine, Clean Catch     Status: Abnormal   Collection Time: 05/14/23  6:21 PM  Result Value Ref Range   Color, Urine YELLOW YELLOW   APPearance HAZY (A) CLEAR   Specific Gravity, Urine 1.024 1.005 - 1.030   pH 5.0 5.0 - 8.0   Glucose, UA NEGATIVE NEGATIVE mg/dL   Hgb urine dipstick LARGE (A) NEGATIVE   Bilirubin Urine NEGATIVE NEGATIVE   Ketones, ur NEGATIVE NEGATIVE mg/dL   Protein, ur 30 (A) NEGATIVE mg/dL   Nitrite NEGATIVE NEGATIVE   Leukocytes,Ua  NEGATIVE NEGATIVE   RBC / HPF 6-10 0 - 5 RBC/hpf   WBC, UA 0-5 0 - 5 WBC/hpf   Bacteria, UA NONE SEEN NONE  SEEN   Squamous Epithelial / HPF 0-5 0 - 5 /HPF   Mucus PRESENT     Comment: Performed at [facility], [address] [place], [place] [address]  CBC     Status: Abnormal   Collection Time: 05/15/23  4:48 AM  Result Value Ref Range   WBC 11.7 (H) 4.0 - 10.5 K/uL   RBC 4.26 3.87 - 5.11 MIL/uL   Hemoglobin 12.2 12.0 - 15.0 g/dL   HCT 29.5 28.4 - 13.2 %   MCV 93.2 80.0 - 100.0 fL   MCH 28.6 26.0 - 34.0 pg   MCHC 30.7 30.0 - 36.0 g/dL   RDW 44.0 10.2 - 72.5 %   Platelets 228 150 - 400 K/uL   nRBC 0.0 0.0 - 0.2 %    Comment: Performed at [facility], [address] [place], [place] [address]  Basic metabolic panel     Status: Abnormal   Collection Time: 05/15/23  4:48 AM  Result Value Ref Range   Sodium 139 135 - 145 mmol/L   Potassium 3.7 3.5 - 5.1 mmol/L   Chloride 108 98 - 111 mmol/L   CO2 26 22 - 32 mmol/L   Glucose, Bld 106 (H) 70 - 99 mg/dL    Comment: Glucose reference range applies only to samples taken after fasting for at least 8 hours.   BUN 15 8 - 23 mg/dL   Creatinine, Ser 0.34 0.44 - 1.00 mg/dL   Calcium 8.4 (L) 8.9 - 10.3 mg/dL   GFR, Estimated >74 >25 mL/min    Comment: (NOTE) Calculated using the CKD-EPI Creatinine Equation (2021)    Anion gap 5 5 - 15    Comment: Performed at [facility], [address] [place], [place] [address]  Hemoglobin A1c     Status: Abnormal   Collection Time: 05/15/23  4:48 AM  Result Value Ref Range   Hgb A1c MFr Bld 5.9 (H) 4.8 - 5.6 %    Comment: (NOTE) Pre diabetes:          5.7%-6.4%  Diabetes:              >6.4%  Glycemic control for   <7.0% adults with diabetes    Mean Plasma Glucose 122.63 mg/dL    Comment: Performed at [facility], [address], [place], [place] [address]  Vitamin B12     Status: Abnormal   Collection Time: 05/15/23  4:48 AM  Result Value Ref Range   Vitamin B-12 1,357 (H) 180 - 914 pg/mL    Comment: (NOTE) This assay is not  validated for testing neonatal or myeloproliferative syndrome specimens for Vitamin B12 levels. Performed at [facility], [address] [place], [place] [address]   Folate     Status: None   Collection Time: 05/15/23  4:48 AM  Result Value Ref Range   Folate 9.4 >5.9 ng/mL    Comment: Performed at [facility], [address], [place], [place] [address]  Iron and TIBC     Status: Abnormal   Collection Time: 05/15/23  4:48 AM  Result Value Ref Range   Iron 26 (L) 28 - 170 ug/dL   TIBC 606 301 - 601 ug/dL   Saturation Ratios 10 (L) 10.4 - 31.8 %  UIBC 246 ug/dL    Comment: Performed at [facility], [address], [place], [place] [address]  Ferritin     Status: None   Collection Time: 05/15/23  4:48 AM  Result Value Ref Range   Ferritin 24 11 - 307 ng/mL    Comment: Performed at [facility], [address] [place], [place] [address]  Reticulocytes     Status: None   Collection Time: 05/15/23  4:48 AM  Result Value Ref Range   Retic Ct Pct 1.4 0.4 - 3.1 %   RBC. 4.17 3.87 - 5.11 MIL/uL   Retic Count, Absolute 59.2 19.0 - 186.0 K/uL   Immature Retic Fract 9.6 2.3 - 15.9 %    Comment: Performed at [facility], [address] [place], [place] [address]    Imaging / Studies: DG Abdomen 1 View  Result Date: 05/14/2023 CLINICAL DATA:  Nasogastric tube placement EXAM: ABDOMEN - 1 VIEW COMPARISON:  Abdominal CT from earlier today FINDINGS: An enteric tube reaches the subdiaphragmatic stomach, hiatal hernia is again noted with internal gas. No upper abdominal bowel distension. Clear lung bases in stable heart size. IMPRESSION: Enteric tube with tip and side-port reaching the subdiaphragmatic stomach. Electronically Signed   By: Tiburcio Pea M.D.   On: 05/14/2023 04:26   CT ABDOMEN PELVIS W CONTRAST  Result Date: 05/14/2023 CLINICAL DATA:  Left upper quadrant abdominal pain. EXAM: CT  ABDOMEN AND PELVIS WITH CONTRAST TECHNIQUE: Multidetector CT imaging of the abdomen and pelvis was performed using the standard protocol following bolus administration of intravenous contrast. RADIATION DOSE REDUCTION: This exam was performed according to the departmental dose-optimization program which includes automated exposure control, adjustment of the mA and/or kV according to patient size and/or use of iterative reconstruction technique. CONTRAST:  OMNIPAQUE IOHEXOL 300 MG/ML  SOLN COMPARISON:  Abdominal CT dated 12/30/2008. FINDINGS: Lower chest: The visualized lung bases are clear. No intra-abdominal free air or free fluid. Hepatobiliary: Fatty liver. A 1.6 cm enhancing focus in the caudate lobe (17/2) was present on the prior CT of 12/30/2008, likely portal venous shunting or flash filling hemangioma. No imaging follow-up. No biliary dilatation. The gallbladder is unremarkable. Pancreas: Unremarkable. No pancreatic ductal dilatation or surrounding inflammatory changes. Spleen: Normal in size without focal abnormality. Adrenals/Urinary Tract: The adrenal glands unremarkable. Small bilateral renal cysts as well as subcentimeter hypodense lesions which are too small to characterize. There is no hydronephrosis on either side. There is symmetric enhancement and excretion of contrast by both kidneys. The visualized ureters and urinary bladder appear unremarkable. Stomach/Bowel: There is a large hiatal hernia containing a portion of the stomach. There is partially visualized mild inflammatory changes surrounding the herniated stomach concerning for a degree of obstruction or gastric volvulus. There is sigmoid diverticulosis without active inflammatory changes. No evidence of small-bowel obstruction. The appendix is normal. Vascular/Lymphatic: Mild aortoiliac atherosclerotic disease. The IVC is unremarkable. No portal venous gas. There is no adenopathy. Reproductive: Hysterectomy.  No adnexal masses. Other:  None Musculoskeletal: Degenerative changes of the spine. No acute osseous pathology. IMPRESSION: 1. Large hiatal hernia containing a portion of the stomach with findings concerning for a degree of obstruction or gastric volvulus. 2. Sigmoid diverticulosis. No bowel obstruction. Normal appendix. 3. Fatty liver. 4.  Aortic Atherosclerosis (ICD10-I70.0). Electronically Signed   By: Elgie Collard M.D.   On: 05/14/2023 02:15    Medications / Allergies: per chart  Antibiotics: Anti-infectives (From admission, onward)    None  Note: Portions of this report may have been transcribed using voice recognition software. Every effort was made to ensure accuracy; however, inadvertent computerized transcription errors may be present.   Any transcriptional errors that result from this process are unintentional.    Ardeth Sportsman, MD, FACS, MASCRS Esophageal, Gastrointestinal & Colorectal Surgery Robotic and Minimally Invasive Surgery  [facility] [address] [place], [place] [address] [phone] Fax [phone] Main  CONTACT INFORMATION: Weekday (9AM-5PM): Call [facility] main office at [phone] Weeknight (5PM-9AM) or Weekend/Holiday: Check EPIC "Web Links" tab & use "AMION" (password " [email or web address]") for General Surgery [facility] coverage  Please, DO NOT use SecureChat  (it is not reliable communication to reach operating surgeons & will lead to a delay in care).   Epic staff messaging available for outptient concerns needing 1-2 business day response.      05/15/2023  10:21 AM

## 2023-05-15 NOTE — Progress Notes (Signed)
Mobility Specialist - Progress Note   05/15/23 0853  Mobility  Activity Ambulated independently in hallway  Level of Assistance Independent  Assistive Device None  Distance Ambulated (ft) 500 ft  Activity Response Tolerated well  Mobility Referral Yes  $Mobility charge 1 Mobility  Mobility Specialist Start Time (ACUTE ONLY) 0844  Mobility Specialist Stop Time (ACUTE ONLY) [id number]  Mobility Specialist Time Calculation (min) (ACUTE ONLY) 8 min   Pt received in recliner and agreeable to mobility. No complaints during session. Pt to recliner after session with all needs met.    [facility]

## 2023-05-15 NOTE — Progress Notes (Signed)
[facility]  PROGRESS NOTE  Brenda Hunter:[id number] DOB: 04/18/1935 DOA: 05/13/2023 PCP: Daisy Floro, MD   Brief HPI:    88 y.o. female with medical history significant for hypertension, GERD, esophageal stricture and hiatal hernia being admitted to the hospital with incarcerated hiatal hernia. She has intermittent dysphagia, she also intermittently gets abdominal pain, nausea which lasts for a few minutes, however yesterday after eating lunch she had uncontrolled nausea and vomiting and lots of abdominal cramping which did not get better.     Assessment/Plan:   Incarcerated hiatal hernia -General surgery consulted -NG tube placed -If no improvement, she will need surgery to reduce hiatal hernia -General surgery following  Hypertension -Patient takes metoprolol at home -Will start metoprolol 2.5 mg IV every 12 hours  GERD/history of esophageal stricture -Continue IV pantoprazole   Medications     [START ON 05/17/2023] bupivacaine liposome  20 mL Infiltration Once   [START ON 05/16/2023] Chlorhexidine Gluconate Cloth  6 each Topical Once   And   [START ON 05/17/2023] Chlorhexidine Gluconate Cloth  6 each Topical Once   [START ON 05/17/2023] dexamethasone (DECADRON) injection  4 mg Intravenous On Call to OR   enoxaparin (LOVENOX) injection  40 mg Subcutaneous Q24H   [START ON 05/17/2023] feeding supplement  296 mL Oral Once   [START ON 05/16/2023] feeding supplement  592 mL Oral Once   metoCLOPramide (REGLAN) injection  5 mg Intravenous Q8H   pantoprazole (PROTONIX) IV  40 mg Intravenous Q12H   [START ON 05/17/2023] scopolamine  1 patch Transdermal On Call to OR     Data Reviewed:   CBG:  No results for input(s): "GLUCAP" in the last 168 hours.  SpO2: 93 % O2 Flow Rate (L/min): 2 L/min    Vitals:   05/14/23 2031 05/15/23 0036 05/15/23 0408 05/15/23 1302  BP: 134/71 135/69 (!) 127/54 (!) 148/63  Pulse: 90 95 91 87  Resp: 18 18 18 18   Temp: 98.5 F  (36.9 C) 97.8 F (36.6 C) 99 F (37.2 C) 98.4 F (36.9 C)  TempSrc: Oral Oral Oral Oral  SpO2: 92% 94% 94% 93%  Weight:      Height:          Data Reviewed:  Basic Metabolic Panel: Recent Labs  Lab 05/13/23 2237 05/15/23 0448  NA 137 139  K 4.1 3.7  CL 100 108  CO2 24 26  GLUCOSE 161* 106*  BUN 18 15  CREATININE 0.61 0.60  CALCIUM 9.4 8.4*    CBC: Recent Labs  Lab 05/13/23 2311 05/15/23 0448  WBC 10.4 11.7*  NEUTROABS 8.7*  --   HGB 15.4* 12.2  HCT 47.3* 39.7  MCV 89.2 93.2  PLT 308 228    LFT Recent Labs  Lab 05/13/23 2237  AST 36  ALT 27  ALKPHOS 64  BILITOT 0.5  PROT 8.3*  ALBUMIN 4.5     Antibiotics: Anti-infectives (From admission, onward)    Start     Dose/Rate Route Frequency Ordered Stop   05/17/23 0600  cefTRIAXone (ROCEPHIN) 2 g in sodium chloride 0.9 % 100 mL IVPB        2 g 200 mL/hr over 30 Minutes Intravenous On call to O.R. 05/15/23 1022 05/18/23 0559        DVT prophylaxis: Lovenox  Code Status: Full code  Family Communication:    CONSULTS General surgery   Subjective   Patient seen, denies pain or nausea.  NG tube in place  Objective    Physical Examination:   General-appears in no acute distress, NG tube in place Heart-S1-S2, regular, no murmur auscultated Lungs-clear to auscultation bilaterally, no wheezing or crackles auscultated Abdomen-soft, nontender, no organomegaly Extremities-no edema in the lower extremities Neuro-alert, oriented x3, no focal deficit noted  Status is: Inpatient:             [name]   [facility] If 7PM-7AM, please contact night-coverage at [email or web address], Office  [phone]   05/15/2023, 3:08 PM  LOS: 1 day

## 2023-05-16 DIAGNOSIS — K311 Adult hypertrophic pyloric stenosis: Secondary | ICD-10-CM | POA: Diagnosis not present

## 2023-05-16 DIAGNOSIS — R1013 Epigastric pain: Secondary | ICD-10-CM | POA: Diagnosis not present

## 2023-05-16 DIAGNOSIS — K449 Diaphragmatic hernia without obstruction or gangrene: Secondary | ICD-10-CM

## 2023-05-16 DIAGNOSIS — Z8711 Personal history of peptic ulcer disease: Secondary | ICD-10-CM | POA: Diagnosis not present

## 2023-05-16 LAB — PREALBUMIN: Prealbumin: 12 mg/dL — ABNORMAL LOW (ref 18–38)

## 2023-05-16 LAB — COMPREHENSIVE METABOLIC PANEL
ALT: 21 U/L (ref 0–44)
AST: 28 U/L (ref 15–41)
Albumin: 3.2 g/dL — ABNORMAL LOW (ref 3.5–5.0)
Alkaline Phosphatase: 44 U/L (ref 38–126)
Anion gap: 8 (ref 5–15)
BUN: 11 mg/dL (ref 8–23)
CO2: 24 mmol/L (ref 22–32)
Calcium: 8 mg/dL — ABNORMAL LOW (ref 8.9–10.3)
Chloride: 108 mmol/L (ref 98–111)
Creatinine, Ser: 0.54 mg/dL (ref 0.44–1.00)
GFR, Estimated: >60 mL/min (ref >60)
Glucose, Bld: 93 mg/dL (ref 70–99)
Potassium: 3.1 mmol/L — ABNORMAL LOW (ref 3.5–5.1)
Sodium: 140 mmol/L (ref 135–145)
Total Bilirubin: 1 mg/dL (ref 0.3–1.2)
Total Protein: 6 g/dL — ABNORMAL LOW (ref 6.5–8.1)

## 2023-05-16 MED ORDER — POTASSIUM CHLORIDE 10 MEQ/100ML IV SOLN
10.0000 meq | INTRAVENOUS | Status: AC
  Administered 2023-05-16 (×2): 10 meq via INTRAVENOUS
  Filled 2023-05-16: qty 100

## 2023-05-16 NOTE — Progress Notes (Signed)
   05/16/23 1225  TOC Brief Assessment  Insurance and Status Reviewed  Patient has primary care physician Yes  Home environment has been reviewed Resides with spouse  Prior level of function: Independent at baseline  Prior/Current Home Services No current home services  Social Determinants of Health Reivew SDOH reviewed no interventions necessary  Readmission risk has been reviewed Yes  Transition of care needs no transition of care needs at this time

## 2023-05-16 NOTE — Progress Notes (Signed)
After much advocating, it looks like there is a possibility to do a robotic total hernia repair by myself tomorrow afternoon.  Orders placed.    [facility] general surgery team should help evaluate later today.

## 2023-05-16 NOTE — Progress Notes (Signed)
Mobility Specialist - Progress Note   05/16/23 0912  Mobility  Activity Ambulated with assistance in hallway  Level of Assistance Modified independent, requires aide device or extra time  Assistive Device Other (Comment) (IV Pole)  Distance Ambulated (ft) 500 ft  Activity Response Tolerated well  Mobility Referral Yes  $Mobility charge 1 Mobility  Mobility Specialist Start Time (ACUTE ONLY) [id number]  Mobility Specialist Stop Time (ACUTE ONLY) 0911  Mobility Specialist Time Calculation (min) (ACUTE ONLY) 5 min   Pt received in bed and agreeable to mobility. No complaints during session. Pt to bed after session with all needs met.   [facility]

## 2023-05-16 NOTE — Progress Notes (Signed)
NGT residuals shows 0ml at 2200 and 0ml at 0330

## 2023-05-16 NOTE — Progress Notes (Signed)
[facility]  PROGRESS NOTE  Brenda Hunter ION:[id number] DOB: 06-04-1935 DOA: 05/13/2023 PCP: Brenda Floro, MD   Brief HPI:    88 y.o. female with medical history significant for hypertension, GERD, esophageal stricture and hiatal hernia being admitted to the hospital with incarcerated hiatal hernia. She has intermittent dysphagia, she also intermittently gets abdominal pain, nausea which lasts for a few minutes, however yesterday after eating lunch she had uncontrolled nausea and vomiting and lots of abdominal cramping which did not get better.     Assessment/Plan:   Incarcerated hiatal hernia -General surgery consulted -NG tube placed -NG tube clamped, tolerating clear liquid diet -Plan for robotic surgery by Dr. Michaell Cowing in a.m.   Hypertension -Patient takes metoprolol at home -Continue metoprolol 2.5 mg IV every 12 hours  GERD/history of esophageal stricture -Continue IV pantoprazole  Hypokalemia -Potassium was 3.1 -Replace potassium and follow BMP in am   Medications     [START ON 05/17/2023] bupivacaine liposome  20 mL Infiltration Once   [START ON 05/17/2023] dexamethasone (DECADRON) injection  4 mg Intravenous On Call to OR   enoxaparin (LOVENOX) injection  40 mg Subcutaneous Q24H   metoprolol tartrate  2.5 mg Intravenous Q12H   pantoprazole (PROTONIX) IV  40 mg Intravenous Q12H   [START ON 05/17/2023] scopolamine  1 patch Transdermal On Call to OR     Data Reviewed:   CBG:  No results for input(s): "GLUCAP" in the last 168 hours.  SpO2: 93 % O2 Flow Rate (L/min): 2 L/min    Vitals:   05/15/23 0408 05/15/23 1302 05/15/23 2008 05/16/23 0604  BP: (!) 127/54 (!) 148/63 (!) 144/60 (!) 160/68  Pulse: 91 87 94 82  Resp: 18 18 16 18   Temp: 99 F (37.2 C) 98.4 F (36.9 C) 97.7 F (36.5 C) 98.1 F (36.7 C)  TempSrc: Oral Oral Oral Oral  SpO2: 94% 93% 93% 93%  Weight:      Height:          Data Reviewed:  Basic Metabolic Panel: Recent Labs   Lab 05/13/23 2237 05/15/23 0448 05/16/23 0003  NA 137 139 140  K 4.1 3.7 3.1*  CL 100 108 108  CO2 24 26 24   GLUCOSE 161* 106* 93  BUN 18 15 11   CREATININE 0.61 0.60 0.54  CALCIUM 9.4 8.4* 8.0*    CBC: Recent Labs  Lab 05/13/23 2311 05/15/23 0448  WBC 10.4 11.7*  NEUTROABS 8.7*  --   HGB 15.4* 12.2  HCT 47.3* 39.7  MCV 89.2 93.2  PLT 308 228    LFT Recent Labs  Lab 05/13/23 2237 05/16/23 0003  AST 36 28  ALT 27 21  ALKPHOS 64 44  BILITOT 0.5 1.0  PROT 8.3* 6.0*  ALBUMIN 4.5 3.2*     Antibiotics: Anti-infectives (From admission, onward)    Start     Dose/Rate Route Frequency Ordered Stop   05/17/23 0600  cefTRIAXone (ROCEPHIN) 2 g in sodium chloride 0.9 % 100 mL IVPB        2 g 200 mL/hr over 30 Minutes Intravenous On call to O.R. 05/15/23 1022 05/18/23 0559        DVT prophylaxis: Lovenox  Code Status: Full code  Family Communication:    CONSULTS General surgery   Subjective   Denies nausea and vomiting.  NG tube in place  Objective    Physical Examination:   General-appears in no acute distress Heart-S1-S2, regular, no murmur auscultated Lungs-clear to auscultation bilaterally, no  wheezing or crackles auscultated Abdomen-soft, nontender, no organomegaly Extremities-no edema in the lower extremities Neuro-alert, oriented x3, no focal deficit noted  Status is: Inpatient:      [name]   [facility] If 7PM-7AM, please contact night-coverage at [email or web address], Office  [phone]   05/16/2023, 9:43 AM  LOS: 2 days

## 2023-05-16 NOTE — Progress Notes (Signed)
   Progress Note     Subjective: Pt reports no abdominal pain. Passing flatus, no BM. NGT is clamped and she denies nausea or vomiting. NGT is uncomfortable. Family at bedside.   Objective: Vital signs in last 24 hours: Temp:  [97.7 F (36.5 C)-98.4 F (36.9 C)] 98.1 F (36.7 C) (07/22 0604) Pulse Rate:  [82-94] 82 (07/22 0604) Resp:  [16-18] 18 (07/22 0604) BP: (144-160)/(60-68) 160/68 (07/22 0604) SpO2:  [93 %] 93 % (07/22 0604) Last BM Date : 05/13/23  Intake/Output from previous day: 07/21 0701 - 07/22 0700 In: 1475 [P.O.:360; I.V.:1005; NG/GT:60; IV Piggyback:50] Out: 1550 [Urine:1550] Intake/Output this shift: Total I/O In: -  Out: 400 [Urine:400]  PE: General: pleasant, WD, elderly female who is laying in bed in NAD Lungs: CTAB, no wheezes, rhonchi, or rales noted.  Abd: soft, NT, ND, NGT clamped  Psych: A&Ox3 with an appropriate affect.    Lab Results:  Recent Labs    05/13/23 2311 05/15/23 0448  WBC 10.4 11.7*  HGB 15.4* 12.2  HCT 47.3* 39.7  PLT 308 228   BMET Recent Labs    05/15/23 0448 05/16/23 0003  NA 139 140  K 3.7 3.1*  CL 108 108  CO2 26 24  GLUCOSE 106* 93  BUN 15 11  CREATININE 0.60 0.54  CALCIUM 8.4* 8.0*   PT/INR No results for input(s): "LABPROT", "INR" in the last 72 hours. CMP     Component Value Date/Time   NA 140 05/16/2023 0003   K 3.1 (L) 05/16/2023 0003   CL 108 05/16/2023 0003   CO2 24 05/16/2023 0003   GLUCOSE 93 05/16/2023 0003   BUN 11 05/16/2023 0003   CREATININE 0.54 05/16/2023 0003   CALCIUM 8.0 (L) 05/16/2023 0003   PROT 6.0 (L) 05/16/2023 0003   ALBUMIN 3.2 (L) 05/16/2023 0003   AST 28 05/16/2023 0003   ALT 21 05/16/2023 0003   ALKPHOS 44 05/16/2023 0003   BILITOT 1.0 05/16/2023 0003   GFRNONAA >60 05/16/2023 0003   Lipase     Component Value Date/Time   LIPASE 131 (H) 05/13/2023 2237       Studies/Results: No results found.  Anti-infectives: Anti-infectives (From admission, onward)     Start     Dose/Rate Route Frequency Ordered Stop   05/17/23 0600  cefTRIAXone (ROCEPHIN) 2 g in sodium chloride 0.9 % 100 mL IVPB        2 g 200 mL/hr over 30 Minutes Intravenous On call to O.R. 05/15/23 1022 05/18/23 0559        Assessment/Plan  Chronically incarcerated hiatal hernia with GOO - continue NGT, ok to have clamped if tolerated  - planning for robotic assisted hernia repair with Dr. Michaell Cowing tomorrow   - ok to have liquids and then nothing by mouth after MN - PPI - mobilize as tolerated   FEN: CLD, NPO after MN VTE: LMWH ID: [email or web address] pre-op ordered   LOS: 2 days   I reviewed hospitalist notes, last 24 h vitals and pain scores, last 48 h intake and output, last 24 h labs and trends, and last 24 h imaging results.   Juliet Rude, [facility] 05/16/2023, 12:44 PM Please see Amion for pager number during day hours 7:00am-4:30pm

## 2023-05-17 ENCOUNTER — Other Ambulatory Visit: Payer: Self-pay

## 2023-05-17 ENCOUNTER — Inpatient Hospital Stay (HOSPITAL_COMMUNITY): Payer: Medicare Other | Admitting: Anesthesiology

## 2023-05-17 ENCOUNTER — Encounter (HOSPITAL_COMMUNITY): Payer: Self-pay | Admitting: Internal Medicine

## 2023-05-17 ENCOUNTER — Encounter (HOSPITAL_COMMUNITY)
Admission: EM | Disposition: A | Discharge status: Home / Self Care | Payer: Self-pay | Source: Home / Self Care | Attending: Family Medicine

## 2023-05-17 DIAGNOSIS — Z8711 Personal history of peptic ulcer disease: Secondary | ICD-10-CM | POA: Diagnosis not present

## 2023-05-17 DIAGNOSIS — K449 Diaphragmatic hernia without obstruction or gangrene: Secondary | ICD-10-CM | POA: Diagnosis not present

## 2023-05-17 DIAGNOSIS — I10 Essential (primary) hypertension: Secondary | ICD-10-CM

## 2023-05-17 DIAGNOSIS — R1013 Epigastric pain: Secondary | ICD-10-CM | POA: Diagnosis not present

## 2023-05-17 DIAGNOSIS — D509 Iron deficiency anemia, unspecified: Secondary | ICD-10-CM

## 2023-05-17 DIAGNOSIS — K311 Adult hypertrophic pyloric stenosis: Secondary | ICD-10-CM | POA: Diagnosis not present

## 2023-05-17 DIAGNOSIS — K440 Diaphragmatic hernia with obstruction, without gangrene: Secondary | ICD-10-CM

## 2023-05-17 DIAGNOSIS — I444 Left anterior fascicular block: Secondary | ICD-10-CM

## 2023-05-17 HISTORY — PX: XI ROBOTIC ASSISTED HIATAL HERNIA REPAIR: SHX6889

## 2023-05-17 LAB — MAGNESIUM: Magnesium: 1.7 mg/dL (ref 1.7–2.4)

## 2023-05-17 LAB — BASIC METABOLIC PANEL
Anion gap: 9 (ref 5–15)
BUN: 7 mg/dL — ABNORMAL LOW (ref 8–23)
CO2: 22 mmol/L (ref 22–32)
Calcium: 8.1 mg/dL — ABNORMAL LOW (ref 8.9–10.3)
Chloride: 108 mmol/L (ref 98–111)
Creatinine, Ser: 0.51 mg/dL (ref 0.44–1.00)
GFR, Estimated: >60 mL/min (ref >60)
Glucose, Bld: 86 mg/dL (ref 70–99)
Potassium: 3.1 mmol/L — ABNORMAL LOW (ref 3.5–5.1)
Sodium: 139 mmol/L (ref 135–145)

## 2023-05-17 LAB — MRSA NEXT GEN BY PCR, NASAL: MRSA by PCR Next Gen: NOT DETECTED

## 2023-05-17 SURGERY — REPAIR, HERNIA, HIATAL, ROBOT-ASSISTED
Anesthesia: General | Site: Abdomen

## 2023-05-17 MED ORDER — SODIUM CHLORIDE 0.9% FLUSH: 3.0000 mL | INTRAVENOUS | Status: DC | PRN

## 2023-05-17 MED ORDER — HYDRALAZINE HCL 20 MG/ML IJ SOLN
5.0000 mg | Freq: Once | INTRAMUSCULAR | Status: AC
  Administered 2023-05-17: 5 mg via INTRAVENOUS
  Filled 2023-05-17: qty 1

## 2023-05-17 MED ORDER — BUPIVACAINE LIPOSOME 1.3 % IJ SUSP
INTRAMUSCULAR | Status: AC
  Filled 2023-05-17: qty 20

## 2023-05-17 MED ORDER — ACETAMINOPHEN 10 MG/ML IV SOLN
1000.0000 mg | Freq: Once | INTRAVENOUS | Status: AC
  Administered 2023-05-17: 1000 mg via INTRAVENOUS

## 2023-05-17 MED ORDER — ENSURE PRE-SURGERY PO LIQD
592.0000 mL | Freq: Once | ORAL | Status: DC
  Filled 2023-05-17: qty 592

## 2023-05-17 MED ORDER — CHLORHEXIDINE GLUCONATE CLOTH 2 % EX PADS: 6.0000 | MEDICATED_PAD | Freq: Once | CUTANEOUS | Status: DC

## 2023-05-17 MED ORDER — POTASSIUM CHLORIDE 10 MEQ/100ML IV SOLN
10.0000 meq | INTRAVENOUS | Status: AC
  Administered 2023-05-17 (×3): 10 meq via INTRAVENOUS
  Filled 2023-05-17 (×3): qty 100

## 2023-05-17 MED ORDER — SUCCINYLCHOLINE CHLORIDE 200 MG/10ML IV SOSY
PREFILLED_SYRINGE | INTRAVENOUS | Status: AC
  Filled 2023-05-17: qty 10

## 2023-05-17 MED ORDER — LIDOCAINE HCL (PF) 2 % IJ SOLN
INTRAMUSCULAR | Status: AC
  Filled 2023-05-17: qty 10

## 2023-05-17 MED ORDER — LACTATED RINGERS IR SOLN
Status: DC | PRN
  Administered 2023-05-17: 1000 mL

## 2023-05-17 MED ORDER — ROCURONIUM BROMIDE 10 MG/ML (PF) SYRINGE
PREFILLED_SYRINGE | INTRAVENOUS | Status: DC | PRN
  Administered 2023-05-17: 20 mg via INTRAVENOUS
  Administered 2023-05-17: 60 mg via INTRAVENOUS

## 2023-05-17 MED ORDER — SODIUM CHLORIDE 0.9% FLUSH
3.0000 mL | Freq: Two times a day (BID) | INTRAVENOUS | Status: DC
  Administered 2023-05-17: 3 mL via INTRAVENOUS

## 2023-05-17 MED ORDER — DEXAMETHASONE SODIUM PHOSPHATE 10 MG/ML IJ SOLN
INTRAMUSCULAR | Status: AC
  Filled 2023-05-17: qty 1

## 2023-05-17 MED ORDER — SUCCINYLCHOLINE CHLORIDE 200 MG/10ML IV SOSY
PREFILLED_SYRINGE | INTRAVENOUS | Status: DC | PRN
  Administered 2023-05-17: 100 mg via INTRAVENOUS

## 2023-05-17 MED ORDER — LIDOCAINE 2% (20 MG/ML) 5 ML SYRINGE
INTRAMUSCULAR | Status: DC | PRN
  Administered 2023-05-17: 60 mg via INTRAVENOUS

## 2023-05-17 MED ORDER — CHLORHEXIDINE GLUCONATE 0.12 % MT SOLN
15.0000 mL | Freq: Once | OROMUCOSAL | Status: AC
  Administered 2023-05-17: 15 mL via OROMUCOSAL

## 2023-05-17 MED ORDER — LACTATED RINGERS IV BOLUS: 1000.0000 mL | Freq: Three times a day (TID) | INTRAVENOUS | Status: DC | PRN

## 2023-05-17 MED ORDER — PHENYLEPHRINE 80 MCG/ML (10ML) SYRINGE FOR IV PUSH (FOR BLOOD PRESSURE SUPPORT)
PREFILLED_SYRINGE | INTRAVENOUS | Status: DC | PRN
  Administered 2023-05-17: 80 ug via INTRAVENOUS
  Administered 2023-05-17: 160 ug via INTRAVENOUS
  Administered 2023-05-17: 80 ug via INTRAVENOUS
  Administered 2023-05-17: 160 ug via INTRAVENOUS

## 2023-05-17 MED ORDER — FENTANYL CITRATE (PF) 100 MCG/2ML IJ SOLN
INTRAMUSCULAR | Status: AC
  Filled 2023-05-17: qty 2

## 2023-05-17 MED ORDER — PROPOFOL 10 MG/ML IV BOLUS
INTRAVENOUS | Status: AC
  Filled 2023-05-17: qty 20

## 2023-05-17 MED ORDER — ONDANSETRON HCL 4 MG/2ML IJ SOLN
INTRAMUSCULAR | Status: AC
  Filled 2023-05-17: qty 2

## 2023-05-17 MED ORDER — LACTATED RINGERS IV SOLN: INTRAVENOUS | Status: DC

## 2023-05-17 MED ORDER — SUGAMMADEX SODIUM 200 MG/2ML IV SOLN
INTRAVENOUS | Status: DC | PRN
  Administered 2023-05-17: 150 mg via INTRAVENOUS

## 2023-05-17 MED ORDER — DEXAMETHASONE SODIUM PHOSPHATE 10 MG/ML IJ SOLN
INTRAMUSCULAR | Status: DC | PRN
  Administered 2023-05-17: 8 mg via INTRAVENOUS

## 2023-05-17 MED ORDER — ENSURE PRE-SURGERY PO LIQD: 296.0000 mL | Freq: Once | ORAL | Status: DC

## 2023-05-17 MED ORDER — SODIUM CHLORIDE 0.9 % IV SOLN: 250.0000 mL | INTRAVENOUS | Status: DC | PRN

## 2023-05-17 MED ORDER — 0.9 % SODIUM CHLORIDE (POUR BTL) OPTIME
TOPICAL | Status: DC | PRN
  Administered 2023-05-17: 1000 mL

## 2023-05-17 MED ORDER — HYDROMORPHONE HCL 1 MG/ML IJ SOLN
0.5000 mg | INTRAMUSCULAR | Status: DC | PRN
  Administered 2023-05-17: 0.5 mg via INTRAVENOUS
  Filled 2023-05-17: qty 1

## 2023-05-17 MED ORDER — FENTANYL CITRATE PF 50 MCG/ML IJ SOSY
PREFILLED_SYRINGE | INTRAMUSCULAR | Status: AC
  Administered 2023-05-17: 50 ug via INTRAVENOUS
  Filled 2023-05-17: qty 2

## 2023-05-17 MED ORDER — ORAL CARE MOUTH RINSE: 15.0000 mL | Freq: Once | OROMUCOSAL | Status: AC

## 2023-05-17 MED ORDER — BUPIVACAINE HCL 0.25 % IJ SOLN
INTRAMUSCULAR | Status: AC
  Filled 2023-05-17: qty 1

## 2023-05-17 MED ORDER — LIDOCAINE 20MG/ML (2%) 15 ML SYRINGE OPTIME
INTRAMUSCULAR | Status: DC | PRN
  Administered 2023-05-17: 1.5 mg/kg/h via INTRAVENOUS

## 2023-05-17 MED ORDER — ACETAMINOPHEN 10 MG/ML IV SOLN
INTRAVENOUS | Status: AC
  Filled 2023-05-17: qty 100

## 2023-05-17 MED ORDER — DEXMEDETOMIDINE HCL IN NACL 80 MCG/20ML IV SOLN
INTRAVENOUS | Status: DC | PRN
  Administered 2023-05-17: 4 ug via INTRAVENOUS

## 2023-05-17 MED ORDER — SODIUM CHLORIDE 0.9 % IV SOLN: 2.0000 g | INTRAVENOUS | Status: DC

## 2023-05-17 MED ORDER — DEXAMETHASONE SODIUM PHOSPHATE 4 MG/ML IJ SOLN: 4.0000 mg | INTRAMUSCULAR | Status: DC

## 2023-05-17 MED ORDER — HYDROMORPHONE HCL 1 MG/ML IJ SOLN
INTRAMUSCULAR | Status: AC
  Filled 2023-05-17: qty 2

## 2023-05-17 MED ORDER — ROCURONIUM BROMIDE 10 MG/ML (PF) SYRINGE
PREFILLED_SYRINGE | INTRAVENOUS | Status: AC
  Filled 2023-05-17: qty 10

## 2023-05-17 MED ORDER — FENTANYL CITRATE PF 50 MCG/ML IJ SOSY
PREFILLED_SYRINGE | INTRAMUSCULAR | Status: AC
  Filled 2023-05-17: qty 1

## 2023-05-17 MED ORDER — HYDROMORPHONE HCL 1 MG/ML IJ SOLN
0.5000 mg | INTRAMUSCULAR | Status: AC
  Administered 2023-05-17 (×2): 0.5 mg via INTRAVENOUS

## 2023-05-17 MED ORDER — FENTANYL CITRATE (PF) 100 MCG/2ML IJ SOLN
INTRAMUSCULAR | Status: DC | PRN
  Administered 2023-05-17 (×4): 50 ug via INTRAVENOUS

## 2023-05-17 MED ORDER — PROPOFOL 10 MG/ML IV BOLUS
INTRAVENOUS | Status: DC | PRN
  Administered 2023-05-17: 150 mg via INTRAVENOUS

## 2023-05-17 MED ORDER — FENTANYL CITRATE PF 50 MCG/ML IJ SOSY
25.0000 ug | PREFILLED_SYRINGE | INTRAMUSCULAR | Status: DC | PRN
  Administered 2023-05-17 (×2): 50 ug via INTRAVENOUS

## 2023-05-17 MED ORDER — ONDANSETRON HCL 4 MG/2ML IJ SOLN
INTRAMUSCULAR | Status: DC | PRN
  Administered 2023-05-17: 4 mg via INTRAVENOUS

## 2023-05-17 MED ORDER — BUPIVACAINE LIPOSOME 1.3 % IJ SUSP: 20.0000 mL | Freq: Once | INTRAMUSCULAR | Status: DC

## 2023-05-17 SURGICAL SUPPLY — 69 items
APL PRP STRL LF DISP 70% ISPRP (MISCELLANEOUS) ×1
APL SWBSTK 6 STRL LF DISP (MISCELLANEOUS)
APPLICATOR COTTON TIP 6 STRL (MISCELLANEOUS) ×1 IMPLANT
APPLICATOR COTTON TIP 6IN STRL (MISCELLANEOUS)
APPLIER CLIP 5 13 M/L LIGAMAX5 (MISCELLANEOUS)
APR CLP MED LRG 5 ANG JAW (MISCELLANEOUS)
BAG COUNTER SPONGE SURGICOUNT (BAG) ×1 IMPLANT
BAG SPNG CNTER NS LX DISP (BAG) ×1
BLADE SURG SZ11 CARB STEEL (BLADE) ×1 IMPLANT
CHLORAPREP W/TINT 26 (MISCELLANEOUS) ×1 IMPLANT
CLIP APPLIE 5 13 M/L LIGAMAX5 (MISCELLANEOUS) IMPLANT
COVER SURGICAL LIGHT HANDLE (MISCELLANEOUS) ×1 IMPLANT
COVER TIP SHEARS 8 DVNC (MISCELLANEOUS) IMPLANT
DRAIN CHANNEL 19F RND (DRAIN) IMPLANT
DRAIN PENROSE 0.5X18 (DRAIN) IMPLANT
DRAPE ARM DVNC X/XI (DISPOSABLE) ×4 IMPLANT
DRAPE COLUMN DVNC XI (DISPOSABLE) ×1 IMPLANT
DRAPE WARM FLUID 44X44 (DRAPES) ×1 IMPLANT
DRIVER NDL LRG 8 DVNC XI (INSTRUMENTS) ×2 IMPLANT
DRIVER NDL MEGA SUTCUT DVNCXI (INSTRUMENTS) ×1 IMPLANT
DRIVER NDLE LRG 8 DVNC XI (INSTRUMENTS) ×2
DRIVER NDLE MEGA SUTCUT DVNCXI (INSTRUMENTS) ×1
DRSG TEGADERM 2-3/8X2-3/4 SM (GAUZE/BANDAGES/DRESSINGS) ×5 IMPLANT
ELECT REM PT RETURN 15FT ADLT (MISCELLANEOUS) ×1 IMPLANT
ENDOLOOP SUT PDS II 0 18 (SUTURE) IMPLANT
EVACUATOR DRAINAGE 10X20 100CC (DRAIN) IMPLANT
EVACUATOR SILICONE 100CC (DRAIN) IMPLANT
FELT TEFLON 4 X1 (Mesh General) ×1 IMPLANT
FORCEPS PROGRASP DVNC XI (FORCEP) ×1 IMPLANT
GAUZE SPONGE 2X2 8PLY STRL LF (GAUZE/BANDAGES/DRESSINGS) ×1 IMPLANT
GLOVE ECLIPSE 8.0 STRL XLNG CF (GLOVE) ×2 IMPLANT
GLOVE INDICATOR 8.0 STRL GRN (GLOVE) ×2 IMPLANT
GOWN STRL REUS W/ TWL XL LVL3 (GOWN DISPOSABLE) ×4 IMPLANT
GOWN STRL REUS W/TWL XL LVL3 (GOWN DISPOSABLE) ×4
GRASPER SUT TROCAR 14GX15 (MISCELLANEOUS) IMPLANT
GRASPER TIP-UP FEN DVNC XI (INSTRUMENTS) ×1 IMPLANT
IRRIG SUCT STRYKERFLOW 2 WTIP (MISCELLANEOUS) ×1
IRRIGATION SUCT STRKRFLW 2 WTP (MISCELLANEOUS) ×1 IMPLANT
KIT BASIN OR (CUSTOM PROCEDURE TRAY) ×1 IMPLANT
KIT TURNOVER KIT A (KITS) IMPLANT
MESH PHASIX RESORB RECT 10X15 (Mesh General) IMPLANT
NDL HYPO 22X1.5 SAFETY MO (MISCELLANEOUS) ×1 IMPLANT
NEEDLE HYPO 22X1.5 SAFETY MO (MISCELLANEOUS) ×1
PACK CARDIOVASCULAR III (CUSTOM PROCEDURE TRAY) ×1 IMPLANT
PAD POSITIONING PINK XL (MISCELLANEOUS) ×1 IMPLANT
PENCIL SMOKE EVACUATOR (MISCELLANEOUS) IMPLANT
SCISSORS LAP 5X45 EPIX DISP (ENDOMECHANICALS) IMPLANT
SCISSORS MNPLR CVD DVNC XI (INSTRUMENTS) ×1 IMPLANT
SEAL UNIV 5-12 XI (MISCELLANEOUS) ×3 IMPLANT
SEALER VESSEL EXT DVNC XI (MISCELLANEOUS) ×1 IMPLANT
SOL ELECTROSURG ANTI STICK (MISCELLANEOUS) ×1
SOLUTION ELECTROSURG ANTI STCK (MISCELLANEOUS) ×1 IMPLANT
SPIKE FLUID TRANSFER (MISCELLANEOUS) ×1 IMPLANT
STOPCOCK 4 WAY LG BORE MALE ST (IV SETS) ×2 IMPLANT
SUT ETHIBOND 0 36 GRN (SUTURE) ×2 IMPLANT
SUT ETHIBOND NAB CT1 #1 30IN (SUTURE) ×3 IMPLANT
SUT MNCRL AB 4-0 PS2 18 (SUTURE) ×1 IMPLANT
SUT PROLENE 2 0 SH DA (SUTURE) IMPLANT
SUT VICRYL 0 UR6 27IN ABS (SUTURE) IMPLANT
SUT VLOC 180 2-0 6IN GS21 (SUTURE) IMPLANT
SUT VLOC 180 2-0 9IN GS21 (SUTURE) IMPLANT
SYR 10ML LL (SYRINGE) ×1 IMPLANT
SYR 20ML LL LF (SYRINGE) ×1 IMPLANT
TOWEL OR 17X26 10 PK STRL BLUE (TOWEL DISPOSABLE) ×1 IMPLANT
TOWEL OR NON WOVEN STRL DISP B (DISPOSABLE) ×1 IMPLANT
TRAY FOLEY MTR SLVR 14FR STAT (SET/KITS/TRAYS/PACK) IMPLANT
TRAY FOLEY MTR SLVR 16FR STAT (SET/KITS/TRAYS/PACK) IMPLANT
TROCAR ADV FIXATION 5X100MM (TROCAR) ×1 IMPLANT
TUBING INSUFFLATION 10FT LAP (TUBING) ×1 IMPLANT

## 2023-05-17 NOTE — Plan of Care (Signed)

## 2023-05-17 NOTE — Anesthesia Procedure Notes (Signed)
Procedure Name: Intubation Date/Time: 05/17/2023 2:16 PM  Performed by: Shakayla Hickox D, CRNAPre-anesthesia Checklist: Patient identified, Emergency Drugs available, Suction available and Patient being monitored Patient Re-evaluated:Patient Re-evaluated prior to induction Oxygen Delivery Method: Circle system utilized Preoxygenation: Pre-oxygenation with 100% oxygen Induction Type: IV induction and Rapid sequence Laryngoscope Size: Mac and 3 Grade View: Grade I Tube type: Oral Tube size: 7.0 mm Number of attempts: 1 Airway Equipment and Method: Stylet and Oral airway Placement Confirmation: ETT inserted through vocal cords under direct vision, positive ETCO2 and breath sounds checked- equal and bilateral Secured at: 21 cm Tube secured with: Tape Dental Injury: Teeth and Oropharynx as per pre-operative assessment

## 2023-05-17 NOTE — Anesthesia Postprocedure Evaluation (Signed)
Anesthesia Post Note  Patient: Jovani T Yonke  Procedure(s) Performed: XI ROBOTIC ASSISTED HIATAL HERNIA REPAIR (Abdomen)     Patient location during evaluation: PACU Anesthesia Type: General Level of consciousness: awake and alert Pain management: pain level controlled Vital Signs Assessment: post-procedure vital signs reviewed and stable Respiratory status: spontaneous breathing, nonlabored ventilation, respiratory function stable and patient connected to nasal cannula oxygen Cardiovascular status: blood pressure returned to baseline and stable Postop Assessment: no apparent nausea or vomiting Anesthetic complications: no  No notable events documented.  Last Vitals:  Vitals:   05/17/23 1830 05/17/23 1845  BP: (!) 161/69 (!) 161/75  Pulse: 71 71  Resp: 19 (!) 23  Temp:    SpO2: 91% 94%    Last Pain:  Vitals:   05/17/23 1850  TempSrc:   PainSc: 7                  Javar Eshbach,W. EDMOND

## 2023-05-17 NOTE — Transfer of Care (Signed)
Immediate Anesthesia Transfer of Care Note  Patient: Brenda Hunter  Procedure(s) Performed: XI ROBOTIC ASSISTED HIATAL HERNIA REPAIR (Abdomen)  Patient Location: PACU  Anesthesia Type:General  Level of Consciousness: drowsy  Airway & Oxygen Therapy: Patient Spontanous Breathing and Patient connected to face mask oxygen  Post-op Assessment: Report given to RN, Post -op Vital signs reviewed and stable, and Patient moving all extremities X 4  Post vital signs: Reviewed and stable  Last Vitals:  Vitals Value Taken Time  BP 185/81 05/17/23 1709  Temp    Pulse 94 05/17/23 1711  Resp 24 05/17/23 1711  SpO2 97 % 05/17/23 1711  Vitals shown include unfiled device data.  Last Pain:  Vitals:   05/17/23 1125  TempSrc: Oral  PainSc:          Complications: No notable events documented.

## 2023-05-17 NOTE — Op Note (Addendum)
05/17/2023  5:04 PM  PATIENT:  Brenda Hunter  88 y.o. female  Patient Care Team: Daisy Floro, MD as PCP - General (Family Medicine) Jake Bathe, MD as PCP - Cardiology (Cardiology) Jamison Neighbor, MD (Urology) Kathi Der, MD as Consulting Physician (Gastroenterology)  PRE-OPERATIVE DIAGNOSIS:  INCARCERATED HIATAL HERNIA  POST-OPERATIVE DIAGNOSIS:   INCARCERATED HIATAL HERNIA WITH GASTRIC OUTLET OBSTRUCTION GASTRIC MASS  PROCEDURE:   1. ROBOTIC reduction of paraesophageal hiatal hernia 2. Type II mediastinal dissection. 3. Primary repair of hiatal hernia over pledgets.  4. Anterior & posterior gastropexy. 5. Toupet (270 degree partial posterior x 4cm)  fundoplication 6. Mesh reinforcement with absorbable mesh 7. Excision of gastric mass  SURGEON:  Ardeth Sportsman, MD  ASSISTANT:  Almond Lint, MD  An experienced assistant was required given the standard of surgical care given the complexity of the case.  This assistant was needed for exposure, dissection, suction, tissue approximation, retraction, perception, etc  ANESTHESIA:  General endotracheal intubation anesthesia (GETA) and Local & regional field block at incision(s) for perioperative & postoperative pain control provided with liposomal bupivacaine (Experel) 20mL mixed with 30mL of bupivicaine 0.25% with epinephrine  Estimated Blood Loss (EBL):   Total I/O In: 1100 [I.V.:1000; IV Piggyback:100] Out: 625 [Urine:550; Blood:75].   (See anesthesia record)  Delay start of Pharmacological VTE agent (>24hrs) due to concerns of significant anemia, surgical blood loss, or risk of bleeding?:  no  DRAINS: (None) and 19 Fr Blake drain with tip resting in the mediastinum  SPECIMEN:  Stomach - gastric mass 2x1cm  on the anterior fundus.  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  INDICATION:   Patient with symptomatic paraesophageal hiatal hernia.  Worsening episodes of dysphagia with occasional episodes of anemia.  Has had nausea vomiting and cannot keep anything down.  Chronically incarcerated with half the stomach up in the chest.  NG tube.  Cannot tolerate clamping or p.o.  Recommendation made for urgent reduction repair of hiatal hernia:  The anatomy & physiology of the foregut and anti-reflux mechanism was discussed.  The pathophysiology of hiatal herniation and GERD was discussed.  Natural history risks without surgery was discussed.   The patient's symptoms are not adequately controlled by medicines and other non-operative treatments.  I feel the risks of no intervention will lead to serious problems that outweigh the operative risks; therefore, I recommended surgery to reduce the hiatal hernia out of the chest and fundoplication to rebuild the anti-reflux valve and control reflux better.  Need for a thorough workup to rule out the differential diagnosis and plan treatment was explained.  I explained laparoscopic techniques with possible need for an open approach.  Risks such as bleeding, infection, abscess, leak, need for further treatment, heart attack, death, and other risks were discussed.   I noted a good likelihood this will help address the problem.  Goals of post-operative recovery were discussed as well.  Possibility that this will not correct all symptoms was explained.  Post-operative dysphagia, need for short-term liquid & pureed diet, inability to vomit, possibility of reherniation, possible need for medicines to help control symptoms in addition to surgery were discussed.  We will work to minimize complications.   Educational handouts further explaining the pathology, treatment options, and dysphagia diet was given as well.  Questions were  answered.  The patient expresses understanding & wishes to proceed with  surgery.  OR FINDINGS:   Moderate-sized paraesophageal hiatal hernia with 50% of the stomach in the mediastinum.  There was a 10 x 7 cm hiatal defect.  Small firm mass of proximal greater curvature excised (?GIST)  It is a primary repair over pledgets.  Mesh reinforcement was used with PhasixT Mesh: a knitted monofilament mesh scaffold using Poly-4-hydroxybutyrate (P4HB), a biologically derived, fully resorbable material  The patient has a Toupet (240 degree partial posterior x 3cm)  fundoplication.  The patient has had anterior and posterior gastropexy.  DESCRIPTION:   Informed consent was confirmed.  The patient received IV antibiotics prior to incision.  The underwent general anesthesia without difficulty.  A Foley catheter sterilely placed.  The patient was positioned in split leg with arms tucked. The abdomen was prepped and draped in the sterile fashion.  Surgical time-out confirmed our plan.  I placed a 8 mm port in the left subcostal region after using Varess entry technique with the patient in steep reverse Trendelenburg and left side up.  Entry was clean.  We induced carbon dioxide insufflation.  Camera inspection revealed no injury.  Under direct visualization, I placed extra ports.  I also placed a 5 mm port in the left subxiphoid region under direct visualization.  I removed that and placed an Omega-shaped rigid Nathanson liver retractor to lift the left lateral sector of the liver anteriorly to expose the esophageal hiatus.  This was secured to the bed using the iron man system.  The Xi robot was carefully docked and instruments placed and advanced under direct visualization.  We focused on dissection.  Could see a moderate-sized defect with half the stomach up in the mediastinum.  We grasped the anterior mediastinal sac at the apex of the crus.  I scored through that and got into the anterior mediastinum.  I was able to free the mediastinal sac from its attachments to the pericardium  and bilateral pleura using primarily focused gentle blunt dissection as well as focused vessel sealer dissection.  I transected phrenoesophageal attachments to the inner right crus, preserving a two centimeter cuff of mediastinal sac until I found the base of the crura.  I then came around anteriorly on the left side and freed up the phrenoesophageal attachments of the mediastinal sac on the medial part of the left crus on the superior half.  I did careful mediastinal dissection to free the mediastinal sac.  I ended up having to open up the right pleura as well to get some relaxation.  Opened up broadly to avoid any tension situation.  Pressure reduced down to 10 mm.  No evidence of any hypoxia or hemodynamic instability during the case.  With further dissection , we could relieve the suction cup affect of the hernia sac and help reduce the stomach back down into the abdomen, flipped back approriately.    We ligated the short gastrics along the lesser curvature of the stomach about a quarter the way down and then came up proximally over the fundus.  We released the attachments of the stomach to the retroperitoneum until we were able to connect with the prior dissection on the left crus.  We completed the release of phrenoesophageal attachments to the medial part of the left crus down to its base.  With this, we had circumferential mobilization.  We placed the stomach and esophagus on axial tension.  I then did a Type II mediastinal dissection  where I freed the esophagus from its attachments to the aorta, spine, pleura, and pericardium using primarily gentle blunt as well as focused ultrasonic dissection.  We saw the anterior & posterior vagus nerves intact.  We preserved it at all times.  I procedded to dissect about 25 cm proximally into the mediastinum.  With that I could straighten out the esophagus and get 5 cm of intra-abdominal length of the esophagus at a best estimation.   I freed the anterior  mediastinal sac off the esophagus & stomach.  We saw the anterior vagus nerve and freed the sac off of the vagus.  I dissected out & removed the fatty epiphrenic pads at the esophagogastric junction. On the anterior proximal greater curvature in the fundus was an ellipsoid mass adherent - small 1.5cm.  Seemed a little firm and not consistent with adipose tissue.  Not a classic location for lymph node.  Decided to do wedge resection of this and sent that off to rule out GIST or other concern.   With that, I could better define the esophagogastric junction.  I confirmed the the patient had 5 cm of intra-abdominal esophageal length off tension.  I did free the left lateral sector of the liver off the diaphragm and release the right anterior crus adhesions near the phrenic vessels to get some relaxation.  Annitta Needs off the splenic attachments to the diaphragm near the left crus as well as some retroperitoneal fat staying cephalad to the splenic vessels.  With that got some relaxation as well.  The crura seem to come together well without tension so I did not feel like any do any extra releasing incisions on the diaphragm  I brought the fundus of the stomach posterior to the esophagus over to the right side.  The wrap was mobile with the classic shoe shine maneuver.  Wrap became together gently.  We reflected the stomach left laterally and closed the esophageal hiatus using #1 Ethibond stitch using horizontal mattress stitches with pledgets on both sides for the 2 posterior interrupted sutures.  I then did an additional horizontal mattress suture without a pledget called more anteriorly at the apex of the crural closure.  I did that x3 stitches.  The crura had good substance and they came together well without any tension.  Because of the larger defect, I reinforced the repair using a 15x10 cm Phasix mesh.  I cut out a 2x4 cm part of mesh in the middle third of the mesh such that the mesh had a broad U shape  transversely, one narrow tail 5 cm wide & the other broader, 8 cm wide.. We brought the mesh in and laid it over the crural repair, tails anterior over the crura.  I tucked the broader "U" tail of the mesh between the left diaphragm and the spleen, the narrower "U" tail over the right crus.  I secured to the left lateral and left superior sides of the broader "U" tail to the left diaphragm band with 2-0 V lock running suture.  Secured the narrow towel to the right crus using a running 2-0 V-lock suture as well.  Acute care to tack down the edges around the crura closure to avoid any edges of mesh near the diaphragm  I brought the fundus of the stomach behind the esophagus and cardia to set up a fundoplication wrap.  I did a posterior gastropexy x3  by taking of #1 Ethibond interrupted stitches to the posterior part of the right side  of the wrap and thru the mesh and crural closure.  I placed a similar stitch on the left anterior side as well.  That way the stomach covered the mesh and protected the esophagus from any mesh exposure.  With the anterior and posterior gastropexy's, stomach laid well for a fundoplication wrap.  I then did a Toupet fundoplication on the true esophagus above the cardia using 0 Ethibond stitch in the left superior side of the wrap, apex of the left inner crus then left anterior esophagus and tied that down to do a left anterior gastropexy.  Did a mirror-image stitch on the right side to do a right anterior gastropexy.  I then did 2 more distal pairs of suture between the inner part of the wrap and the anterolateral esophagus.  That way there were 3 total pairs of fundoplication sutures offering a posterior 240 degree wrap.  Kept it slightly shorter given the urgency edema to be at 3 cm.    The wrap was soft and floppy.  Some edema and swelling at the hiatal closure so I did release on the left crus anteriorly 1 cm on the fascia of the diaphragm which provided better relaxation such at  a large tip up robotic grasper could easily pass up into the mediastinum.  This provided good snug but not overly tight closure.  Gastric mass, hernia sac, needles removed.  Counts correct.  19 [place] Blake drain placed a drain as noted above.  I did irrigation and ensured hemostasis.  I saw no evidence of any leak or perforation or other abnormality.  I removed the [facility] liver retractor under direct visualization.  I evacuated carbon dioxide and removed the ports.  The skin was closed with Monocryl and sterile dressings applied.  The patient is being extubated and brought back to the recovery room.  Alert in PACU.  I discussed postop care in detail with the patient and family at the bedside yesterday.  Again discussed with the patient and her daughter in the holding area.  I discussed operative findings, updated the patient's status, discussed probable steps to recovery, and gave postoperative recommendations to the patient's daughter, Ashrita Minge,   .  Recommendations were made.  Questions were answered.  She expressed understanding & appreciation.    Ardeth Sportsman, M.D., F.A.C.S. Gastrointestinal and Minimally Invasive Surgery [facility] [address] [place], [place] [address] [phone] Main / Paging

## 2023-05-17 NOTE — Care Management Important Message (Signed)
Important Message  Patient Details IM Letter given. Name: Brenda Hunter MRN: [id number] Date of Birth: 10/12/1935   Medicare Important Message Given:  Yes     Caren Macadam 05/17/2023, 9:30 AM

## 2023-05-17 NOTE — Anesthesia Preprocedure Evaluation (Addendum)
Anesthesia Evaluation  Patient identified by MRN, date of birth, ID band Patient awake    Reviewed: Allergy & Precautions, NPO status , Patient's Chart, lab work & pertinent test results, reviewed documented beta blocker date and time   History of Anesthesia Complications Negative for: history of anesthetic complications  Airway Mallampati: I  TM Distance: >3 FB Neck ROM: Full    Dental  (+) Caps, Dental Advisory Given   Pulmonary neg pulmonary ROS   breath sounds clear to auscultation       Cardiovascular hypertension, Pt. on medications and Pt. on home beta blockers (-) angina + dysrhythmias (palpitations controlled with metoprolol)  Rhythm:Regular Rate:Normal  '17 ECHO: EF 60-65%.  -Wall motion was normal; no regional wall motion abnormalities. (grade 1 diastolic    dysfunction).   - Mitral valve: There was trivial regurgitation.  - Right ventricle: The cavity size was mildly dilated. Wall thickness was normal.  - Right atrium: The atrium was mildly dilated.  - Tricuspid valve: There was mild regurgitation.     Neuro/Psych negative neurological ROS     GI/Hepatic Neg liver ROS, hiatal hernia,GERD  Medicated,,N/V with incarcerated hiatal hernia   Endo/Other  negative endocrine ROS    Renal/GU negative Renal ROS     Musculoskeletal  (+) Arthritis ,    Abdominal   Peds  Hematology negative hematology ROS (+)   Anesthesia Other Findings   Reproductive/Obstetrics                             Anesthesia Physical Anesthesia Plan  ASA: 3  Anesthesia Plan: General   Post-op Pain Management: Tylenol PO (pre-op)*   Induction: Intravenous and Rapid sequence  PONV Risk Score and Plan: 3 and Ondansetron, Dexamethasone and Treatment may vary due to age or medical condition  Airway Management Planned: Oral ETT  Additional Equipment: None  Intra-op Plan:   Post-operative Plan:  Extubation in OR  Informed Consent: I have reviewed the patients History and Physical, chart, labs and discussed the procedure including the risks, benefits and alternatives for the proposed anesthesia with the patient or authorized representative who has indicated his/her understanding and acceptance.     Dental advisory given  Plan Discussed with: Surgeon and CRNA  Anesthesia Plan Comments:        Anesthesia Quick Evaluation

## 2023-05-17 NOTE — Progress Notes (Signed)
   Progress Note  Day of Surgery  Subjective: Pt reports no abdominal pain. Passing flatus, no BM. NGT is clamped and she denies nausea or vomiting. NGT is uncomfortable.   Objective: Vital signs in last 24 hours: Temp:  [97.9 F (36.6 C)-98.4 F (36.9 C)] 97.9 F (36.6 C) (07/23 0534) Pulse Rate:  [75-98] 75 (07/23 0534) Resp:  [18-19] 18 (07/23 0534) BP: (158-179)/(57-79) 176/69 (07/23 0534) SpO2:  [94 %-97 %] 95 % (07/23 0534) Last BM Date : 05/13/23  Intake/Output from previous day: 07/22 0701 - 07/23 0700 In: 3120.8 [P.O.:300; I.V.:2670.8; IV Piggyback:150] Out: 1400 [Urine:1400] Intake/Output this shift: No intake/output data recorded.  PE: General: pleasant, WD, elderly female who is laying in bed in NAD Lungs: CTAB, no wheezes, rhonchi, or rales noted.  Abd: soft, NT, ND, NGT clamped  Psych: A&Ox3 with an appropriate affect.    Lab Results:  Recent Labs    05/15/23 0448  WBC 11.7*  HGB 12.2  HCT 39.7  PLT 228   BMET Recent Labs    05/16/23 0003 05/17/23 0450  NA 140 139  K 3.1* 3.1*  CL 108 108  CO2 24 22  GLUCOSE 93 86  BUN 11 7*  CREATININE 0.54 0.51  CALCIUM 8.0* 8.1*   PT/INR No results for input(s): "LABPROT", "INR" in the last 72 hours. CMP     Component Value Date/Time   NA 139 05/17/2023 0450   K 3.1 (L) 05/17/2023 0450   CL 108 05/17/2023 0450   CO2 22 05/17/2023 0450   GLUCOSE 86 05/17/2023 0450   BUN 7 (L) 05/17/2023 0450   CREATININE 0.51 05/17/2023 0450   CALCIUM 8.1 (L) 05/17/2023 0450   PROT 6.0 (L) 05/16/2023 0003   ALBUMIN 3.2 (L) 05/16/2023 0003   AST 28 05/16/2023 0003   ALT 21 05/16/2023 0003   ALKPHOS 44 05/16/2023 0003   BILITOT 1.0 05/16/2023 0003   GFRNONAA >60 05/17/2023 0450   Lipase     Component Value Date/Time   LIPASE 131 (H) 05/13/2023 2237       Studies/Results: No results found.  Anti-infectives: Anti-infectives (From admission, onward)    Start     Dose/Rate Route Frequency Ordered  Stop   05/17/23 0600  cefTRIAXone (ROCEPHIN) 2 g in sodium chloride 0.9 % 100 mL IVPB        2 g 200 mL/hr over 30 Minutes Intravenous On call to O.R. 05/15/23 1022 05/18/23 0559        Assessment/Plan  Chronically incarcerated hiatal hernia with GOO - continue NGT, ok to have clamped if tolerated  - planning for robotic assisted hernia repair with Dr. Michaell Cowing today   - PPI - mobilize as tolerated   FEN: NPO VTE: LMWH ID: [email or web address] pre-op ordered   LOS: 3 days   I reviewed hospitalist notes, last 24 h vitals and pain scores, last 48 h intake and output, last 24 h labs and trends, and last 24 h imaging results.   Juliet Rude, [facility] 05/17/2023, 10:25 AM Please see Amion for pager number during day hours 7:00am-4:30pm

## 2023-05-17 NOTE — Discharge Instructions (Signed)
EATING AFTER YOUR ESOPHAGEAL SURGERY (Stomach Fundoplication, Hiatal Hernia repair, Achalasia surgery, etc)  ######################################################################  EAT Start with a pureed / full liquid diet (see below) Gradually transition to a high fiber diet with a fiber supplement over the next month after discharge.    WALK Walk an hour a day.  Control your pain to do that.    CONTROL PAIN Control pain so that you can walk, sleep, tolerate sneezing/coughing, go up/down stairs.  HAVE A BOWEL MOVEMENT DAILY Keep your bowels regular to avoid problems.  OK to try a laxative to override constipation.  OK to use an antidairrheal to slow down diarrhea.  Call if not better after 2 tries  CALL IF YOU HAVE PROBLEMS/CONCERNS Call if you are still struggling despite following these instructions. Call if you have concerns not answered by these instructions  ######################################################################   After your esophageal surgery, expect some sticking with swallowing over the next 1-2 months.    If food sticks when you eat, it is called "dysphagia".  This is due to swelling around your esophagus at the wrap & hiatal diaphragm repair.  It will gradually ease off over the next few months.  To help you through this temporary phase, we start you out on a pureed (blenderized) diet.  Your first meal in the hospital was thin liquids.  You should have been given a pureed diet by the time you left the hospital.  We ask patients to stay on a pureed diet for the first 2-3 weeks to avoid anything getting "stuck" near your recent surgery.  Don't be alarmed if your ability to swallow doesn't progress according to this plan.  Everyone is different and some diets can advance more or less quickly.    It is often helpful to crush your medications or split them as they can sometimes stick, especially the first week or so.   Some BASIC RULES to follow are: Maintain  an upright position whenever eating or drinking. Take small bites - just a teaspoon size bite at a time. Eat slowly.  It may also help to eat only one food at a time. Consider nibbling through smaller, more frequent meals & avoid the urge to eat BIG meals Do not push through feelings of fullness, nausea, or bloatedness Do not mix solid foods and liquids in the same mouthful Try not to "wash foods down" with large gulps of liquids. Avoid carbonated (bubbly/fizzy) drinks.   Avoid foods that make you feel gassy or bloated.  Start with bland foods first.  Wait on trying greasy, fried, or spicy meals until you are tolerating more bland solids well. Understand that it will be hard to burp and belch at first.  This gradually improves with time.  Expect to be more gassy/flatulent/bloated initially.  Walking will help your body manage it better. Consider using medications for bloating that contain simethicone such as  Maalox or Gas-X  Consider crushing her medications, especially smaller pills.  The ability to swallow pills should get easier after a few weeks Eat in a relaxed atmosphere & minimize distractions. Avoid talking while eating.   Do not use straws. Following each meal, sit in an upright position (90 degree angle) for 60 to 90 minutes.  Going for a short walk can help as well If food does stick, don't panic.  Try to relax and let the food pass on its own.  Sipping WARM LIQUID such as strong hot Mancini tea can also help slide it down.  Be gradual in changes & use common sense:  -If you easily tolerating a certain "level" of foods, advance to the next level gradually -If you are having trouble swallowing a particular food, then avoid it.   -If food is sticking when you advance your diet, go back to thinner previous diet (the lower LEVEL) for 1-2 days.  LEVEL 1 = PUREED DIET  Do for the first 2 WEEKS AFTER SURGERY  -Foods in this group are pureed or blenderized to a smooth, mashed  potato-like consistency.  -If necessary, the pureed foods can keep their shape with the addition of a thickening agent.   -Meat should be pureed to a smooth, pasty consistency.  Hot broth or gravy may be added to the pureed meat, approximately 1 oz. of liquid per 3 oz. serving of meat. -CAUTION:  If any foods do not puree into a smooth consistency, swallowing will be more difficult.  (For example, nuts or seeds sometimes do not blend well.)  Hot Foods Cold Foods  Pureed scrambled eggs and cheese Pureed cottage cheese  Baby cereals Thickened juices and nectars  Thinned cooked cereals (no lumps) Thickened milk or eggnog  Pureed [place] toast or pancakes Ensure  Mashed potatoes Ice cream  Pureed parsley, au gratin, scalloped potatoes, candied sweet potatoes Fruit or [place] ice, sherbet  Pureed buttered or alfredo noodles Plain yogurt  Pureed vegetables (no corn or peas) Instant breakfast  Pureed soups and creamed soups Smooth pudding, mousse, custard  Pureed scalloped apples Whipped gelatin  Gravies Sugar, syrup, honey, jelly  Sauces, cheese, tomato, barbecue, white, creamed Cream  Any baby food Creamer  Alcohol in moderation (not beer or champagne) Margarine  Coffee or tea Mayonnaise   Ketchup, mustard   Apple sauce   SAMPLE MENU:  PUREED DIET Breakfast Lunch Dinner  Orange juice, 1/2 cup Cream of wheat, 1/2 cup Pineapple juice, 1/2 cup Pureed [place], barley soup, 3/4 cup Pureed Hawaiian chicken, 3 oz  Scrambled eggs, mashed or blended with cheese, 1/2 cup Tea or coffee, 1 cup  Whole milk, 1 cup  Non-dairy creamer, 2 Tbsp. Mashed potatoes, 1/2 cup Pureed cooled broccoli, 1/2 cup Apple sauce, 1/2 cup Coffee or tea Mashed potatoes, 1/2 cup Pureed spinach, 1/2 cup Frozen yogurt, 1/2 cup Tea or coffee      LEVEL 2 = SOFT DIET  After your first 2 weeks, you can advance to a soft diet.   Keep on this diet until everything goes down easily.  Hot Foods Cold Foods  White fish  Cottage cheese  Stuffed fish Junior baby fruit  Baby food meals Semi thickened juices  Minced soft cooked, scrambled, poached eggs nectars  Souffle & omelets Ripe mashed bananas  Cooked cereals Canned fruit, pineapple sauce, milk  potatoes Milkshake  Buttered or Alfredo noodles Custard  Cooked cooled vegetable Puddings, including tapioca  Sherbet Yogurt  Vegetable soup or alphabet soup Fruit ice, [place] ice  Gravies Whipped gelatin  Sugar, syrup, honey, jelly Junior baby desserts  Sauces:  Cheese, creamed, barbecue, tomato, white Cream  Coffee or tea Margarine   SAMPLE MENU:  LEVEL 2 Breakfast Lunch Dinner  Orange juice, 1/2 cup Oatmeal, 1/2 cup Scrambled eggs with cheese, 1/2 cup Decaffeinated tea, 1 cup Whole milk, 1 cup Non-dairy creamer, 2 Tbsp Pineapple juice, 1/2 cup Minced beef, 3 oz Gravy, 2 Tbsp Mashed potatoes, 1/2 cup Minced fresh broccoli, 1/2 cup Applesauce, 1/2 cup Coffee, 1 cup [place], barley soup, 3/4 cup Minced Hawaiian chicken, 3 oz  Mashed potatoes, 1/2 cup Cooked spinach, 1/2 cup Frozen yogurt, 1/2 cup Non-dairy creamer, 2 Tbsp      LEVEL 3 = CHOPPED DIET  -After all the foods in level 2 (soft diet) are passing through well you should advance up to more chopped foods.  -It is still important to cut these foods into small pieces and eat slowly.  Hot Foods Cold Foods  Poultry Cottage cheese  Chopped Swedish meatballs Yogurt  Meat salads (ground or flaked meat) Milk  Flaked fish (tuna) Milkshakes  Poached or scrambled eggs Soft, cold, dry cereal  Souffles and omelets Fruit juices or nectars  Cooked cereals Chopped canned fruit  Chopped [place] toast or pancakes Canned fruit cocktail  Noodles or pasta (no rice) Pudding, mousse, custard  Cooked vegetables (no frozen peas, corn, or mixed vegetables) Green salad  Canned small sweet peas Ice cream  Creamed soup or vegetable soup Fruit ice, [place] ice  Pureed vegetable soup or alphabet soup  Non-dairy creamer  Ground scalloped apples Margarine  Gravies Mayonnaise  Sauces:  Cheese, creamed, barbecue, tomato, white Ketchup  Coffee or tea Mustard   SAMPLE MENU:  LEVEL 3 Breakfast Lunch Dinner  Orange juice, 1/2 cup Oatmeal, 1/2 cup Scrambled eggs with cheese, 1/2 cup Decaffeinated tea, 1 cup Whole milk, 1 cup Non-dairy creamer, 2 Tbsp Ketchup, 1 Tbsp Margarine, 1 tsp Salt, 1/4 tsp Sugar, 2 tsp Pineapple juice, 1/2 cup Ground beef, 3 oz Gravy, 2 Tbsp Mashed potatoes, 1/2 cup Cooked spinach, 1/2 cup Applesauce, 1/2 cup Decaffeinated coffee Whole milk Non-dairy creamer, 2 Tbsp Margarine, 1 tsp Salt, 1/4 tsp Pureed [place], barley soup, 3/4 cup Barbecue chicken, 3 oz Mashed potatoes, 1/2 cup Ground fresh broccoli, 1/2 cup Frozen yogurt, 1/2 cup Decaffeinated tea, 1 cup Non-dairy creamer, 2 Tbsp Margarine, 1 tsp Salt, 1/4 tsp Sugar, 1 tsp    LEVEL 4:  REGULAR FOODS  -Foods in this group are soft, moist, regularly textured foods.   -This level includes meat and breads, which tend to be the hardest things to swallow.   -Eat very slowly, chew well and continue to avoid carbonated drinks. -most people are at this level in 4-6 weeks  Hot Foods Cold Foods  Baked fish or skinned Soft cheeses - cottage cheese  Souffles and omelets Cream cheese  Eggs Yogurt  Stuffed shells Milk  Spaghetti with meat sauce Milkshakes  Cooked cereal Cold dry cereals (no nuts, dried fruit, coconut)  [place] toast or pancakes Crackers  Buttered toast Fruit juices or nectars  Noodles or pasta (no rice) Canned fruit  Potatoes (all types) Ripe bananas  Soft, cooked vegetables (no corn, lima, or baked beans) Peeled, ripe, fresh fruit  Creamed soups or vegetable soup Cakes (no nuts, dried fruit, coconut)  Canned chicken noodle soup Plain doughnuts  Gravies Ice cream  Bacon dressing Pudding, mousse, custard  Sauces:  Cheese, creamed, barbecue, tomato, white Fruit ice, [place] ice, sherbet   Decaffeinated tea or coffee Whipped gelatin  Pork chops Regular gelatin   Canned fruited gelatin molds   Sugar, syrup, honey, jam, jelly   Cream   Non-dairy   Margarine   Oil   Mayonnaise   Ketchup   Mustard   TROUBLESHOOTING IRREGULAR BOWELS  1) Avoid extremes of bowel movements (no bad constipation/diarrhea)  2) Miralax 17gm mixed in 8oz. water or juice-daily. May use BID as needed.  3) Gas-x,Phazyme, etc. as needed for gas & bloating.  4) Soft,bland diet. No spicy,greasy,fried foods.  5) Prilosec over-the-counter  as needed  6) May hold gluten/wheat products from diet to see if symptoms improve.  7) May try probiotics (Align, Activa, etc) to help calm the bowels down  7) If symptoms become worse call back immediately.    If you have any questions please call our office at [facility]: [phone].    ################################################################  LAPAROSCOPIC SURGERY: POST OP INSTRUCTIONS  ######################################################################  EAT Gradually transition to a high fiber diet with a fiber supplement over the next few weeks after discharge.  Start with a pureed / full liquid diet (see below)  WALK Walk an hour a day.  Control your pain to do that.    CONTROL PAIN Control pain so that you can walk, sleep, tolerate sneezing/coughing, go up/down stairs.  HAVE A BOWEL MOVEMENT DAILY Keep your bowels regular to avoid problems.  OK to try a laxative to override constipation.  OK to use an antidairrheal to slow down diarrhea.  Call if not better after 2 tries  CALL IF YOU HAVE PROBLEMS/CONCERNS Call if you are still struggling despite following these instructions. Call if you have concerns not answered by these instructions  ######################################################################    DIET: Follow a light bland diet & liquids the first 24 hours after arrival home, such as soup, liquids,  starches, etc.  Be sure to drink plenty of fluids.  Quickly advance to a usual solid diet within a few days.  Avoid fast food or heavy meals as your are more likely to get nauseated or have irregular bowels.  A low-fat, high-fiber diet for the rest of your life is ideal.  Take your usually prescribed home medications unless otherwise directed.  PAIN CONTROL: Pain is best controlled by a usual combination of three different methods TOGETHER: Ice/Heat Over the counter pain medication Prescription pain medication Most patients will experience some swelling and bruising around the incisions.  Ice packs or heating pads (30-60 minutes up to 6 times a day) will help. Use ice for the first few days to help decrease swelling and bruising, then switch to heat to help relax tight/sore spots and speed recovery.  Some people prefer to use ice alone, heat alone, alternating between ice & heat.  Experiment to what works for you.  Swelling and bruising can take several weeks to resolve.   It is helpful to take an over-the-counter pain medication regularly for the first few weeks.  Choose one of the following that works best for you: Naproxen (Aleve, etc)  Two 220mg  tabs twice a day Ibuprofen (Advil, etc) Three 200mg  tabs four times a day (every meal & bedtime) Acetaminophen (Tylenol, etc) 500-650mg  four times a day (every meal & bedtime) A  prescription for pain medication (such as oxycodone, hydrocodone, tramadol, gabapentin, methocarbamol, etc) should be given to you upon discharge.  Take your pain medication as prescribed.  If you are having problems/concerns with the prescription medicine (does not control pain, nausea, vomiting, rash, itching, etc), please call us [phone] to see if we need to switch you to a different pain medicine that will work better for you and/or control your side effect better. If you need a refill on your pain medication, please give [place] 48 hour notice.  contact your pharmacy.   They will contact our office to request authorization. Prescriptions will not be filled after 5 pm or on week-ends  AVOID GETTING CONSTIPATED.   a.  Between the surgery and the pain medications, it is common to experience some constipation.  b.  Drink plenty of liquids c   ake a fiber supplement 2 times day (such as Metamucil, Citrucel, FiberCon, MiraLax, etc) to have a bowel movement every day. d.  If you have not had a BM by 2 days after surgery: -drink liquids only until you have a bowel movement - take MiraLAX 2 doses every 2 hours until you have a bowel movement   Watch out for diarrhea.   If you have many loose bowel movements, simplify your diet to bland foods & liquids for a few days.   Stop any stool softeners and decrease your fiber supplement.   Switching to mild anti-diarrheal medications (Kayopectate, Pepto Bismol) can help.   If this worsens or does not improve, please call us.  Wash / shower every day.  You may shower over the dressings as they are waterproof.  Continue to shower over incision(s) after the dressing is off.  It is good for closed incisions and even open wounds to be washed every day.  Shower every day.  Short baths are fine.  Wash the incisions and wounds clean with soap & water.    You may leave closed incisions open to air if it is dry.   You may cover the incision with clean gauze & replace it after your daily shower for comfort.  TEGADERM:  You have clear gauze band-aid dressings over your closed incision(s).  Remove the dressings 3 days after surgery. = Friday 7/26    ACTIVITIES as tolerated:   You may resume regular (light) daily activities beginning the next day--such as daily self-care, walking, climbing stairs--gradually increasing activities as tolerated.  If you can walk 30 minutes without difficulty, it is safe to try more intense activity such as jogging, treadmill, bicycling, low-impact aerobics, swimming, etc. Save the most intensive and  strenuous activity for last such as sit-ups, heavy lifting, contact sports, etc  Refrain from any heavy lifting or straining until you are off narcotics for pain control.   DO NOT PUSH THROUGH PAIN.  Let pain be your guide: If it hurts to do something, don't do it.  Pain is your body warning you to avoid that activity for another week until the pain goes down. You may drive when you are no longer taking prescription pain medication, you can comfortably wear a seatbelt, and you can safely maneuver your car and apply brakes. You may have sexual intercourse when it is comfortable.  FOLLOW UP in our office Please call [facility] at [phone] to set up an appointment to see your surgeon in the office for a follow-up appointment approximately 2-3 weeks after your surgery. Make sure that you call for this appointment the day you arrive home to insure a convenient appointment time.  10. IF YOU HAVE DISABILITY OR FAMILY LEAVE FORMS, BRING THEM TO THE OFFICE FOR PROCESSING.  DO NOT GIVE THEM TO YOUR DOCTOR.   WHEN TO CALL us [phone]: Poor pain control Reactions / problems with new medications (rash/itching, nausea, etc)  Fever over 101.5 F (38.5 C) Inability to urinate Nausea and/or vomiting Worsening swelling or bruising Continued bleeding from incision. Increased pain, redness, or drainage from the incision   The clinic staff is available to answer your questions during regular business hours (8:30am-5pm).  Please don't hesitate to call and ask to speak to one of our nurses for clinical concerns.   If you have a medical emergency, go to the nearest emergency room or call 911.  A Careers adviser from [facility]  Surgery is always on call at the [facility] [address], [address], [place], [place]  [address] ? MAIN: [phone] ? TOLL FREE: [phone] ?  FAX [phone]  [phone] [email or web address]  ##############################################################

## 2023-05-17 NOTE — Progress Notes (Signed)
[facility]  PROGRESS NOTE  Brenda Hunter XBM:[id number] DOB: 01-20-1935 DOA: 05/13/2023 PCP: Brenda Floro, MD   Brief HPI:    88 y.o. female with medical history significant for hypertension, GERD, esophageal stricture and hiatal hernia being admitted to the hospital with incarcerated hiatal hernia. She has intermittent dysphagia, she also intermittently gets abdominal pain, nausea which lasts for a few minutes, however yesterday after eating lunch she had uncontrolled nausea and vomiting and lots of abdominal cramping which did not get better.     Assessment/Plan:   Incarcerated hiatal hernia -General surgery consulted -NG tube placed -NG tube clamped, tolerating clear liquid diet -Plan for robotic surgery by Dr. Michaell Cowing today   Hypertension -Patient takes metoprolol at home -Continue metoprolol 2.5 mg IV every 12 hours  GERD/history of esophageal stricture -Continue IV pantoprazole  Hypokalemia -Potassium is again 3.1 despite replacement yesterday -Will give IV KCl 10 mEq x 3 -Check serum magnesium    Medications     bupivacaine liposome  20 mL Infiltration Once   Chlorhexidine Gluconate Cloth  6 each Topical Once   And   Chlorhexidine Gluconate Cloth  6 each Topical Once   dexamethasone (DECADRON) injection  4 mg Intravenous On Call to OR   enoxaparin (LOVENOX) injection  40 mg Subcutaneous Q24H   feeding supplement  296 mL Oral Once   metoprolol tartrate  2.5 mg Intravenous Q12H   pantoprazole (PROTONIX) IV  40 mg Intravenous Q12H   scopolamine  1 patch Transdermal On Call to OR     Data Reviewed:   CBG:  No results for input(s): "GLUCAP" in the last 168 hours.  SpO2: 95 % O2 Flow Rate (L/min): 2 L/min    Vitals:   05/16/23 1402 05/16/23 2055 05/17/23 0126 05/17/23 0534  BP: (!) 158/57 (!) 179/68 (!) 175/79 (!) 176/69  Pulse: 98 76 83 75  Resp: 18 19 19 18   Temp: 98 F (36.7 C) 98 F (36.7 C) 98.4 F (36.9 C) 97.9 F (36.6 C)   TempSrc: Oral Oral Oral Oral  SpO2: 97% 96% 94% 95%  Weight:      Height:          Data Reviewed:  Basic Metabolic Panel: Recent Labs  Lab 05/13/23 2237 05/15/23 0448 05/16/23 0003 05/17/23 0450  NA 137 139 140 139  K 4.1 3.7 3.1* 3.1*  CL 100 108 108 108  CO2 24 26 24 22   GLUCOSE 161* 106* 93 86  BUN 18 15 11  7*  CREATININE 0.61 0.60 0.54 0.51  CALCIUM 9.4 8.4* 8.0* 8.1*    CBC: Recent Labs  Lab 05/13/23 2311 05/15/23 0448  WBC 10.4 11.7*  NEUTROABS 8.7*  --   HGB 15.4* 12.2  HCT 47.3* 39.7  MCV 89.2 93.2  PLT 308 228    LFT Recent Labs  Lab 05/13/23 2237 05/16/23 0003  AST 36 28  ALT 27 21  ALKPHOS 64 44  BILITOT 0.5 1.0  PROT 8.3* 6.0*  ALBUMIN 4.5 3.2*     Antibiotics: Anti-infectives (From admission, onward)    Start     Dose/Rate Route Frequency Ordered Stop   05/17/23 0600  cefTRIAXone (ROCEPHIN) 2 g in sodium chloride 0.9 % 100 mL IVPB        2 g 200 mL/hr over 30 Minutes Intravenous On call to O.R. 05/15/23 1022 05/18/23 0559        DVT prophylaxis: Lovenox  Code Status: Full code  Family Communication:  CONSULTS General surgery   Subjective   Denies nausea or vomiting.  Currently NPO.  NG tube in place  Objective    Physical Examination:   General-appears in no acute distress, NG tube clamped Heart-S1-S2, regular, no murmur auscultated Lungs-clear to auscultation bilaterally Abdomen-soft, nontender, no organomegaly Extremities-no edema in the lower extremities Neuro-alert, oriented x3, no focal deficit noted  Status is: Inpatient:      [name]   [facility] If 7PM-7AM, please contact night-coverage at [email or web address], Office  [phone]   05/17/2023, 9:17 AM  LOS: 3 days

## 2023-05-18 ENCOUNTER — Encounter (HOSPITAL_COMMUNITY): Payer: Self-pay | Admitting: Surgery

## 2023-05-18 ENCOUNTER — Inpatient Hospital Stay (HOSPITAL_COMMUNITY): Payer: Medicare Other

## 2023-05-18 DIAGNOSIS — I10 Essential (primary) hypertension: Secondary | ICD-10-CM | POA: Diagnosis not present

## 2023-05-18 DIAGNOSIS — K311 Adult hypertrophic pyloric stenosis: Secondary | ICD-10-CM | POA: Diagnosis not present

## 2023-05-18 DIAGNOSIS — R1013 Epigastric pain: Secondary | ICD-10-CM | POA: Diagnosis not present

## 2023-05-18 DIAGNOSIS — K449 Diaphragmatic hernia without obstruction or gangrene: Secondary | ICD-10-CM | POA: Diagnosis not present

## 2023-05-18 LAB — MAGNESIUM: Magnesium: 1.9 mg/dL (ref 1.7–2.4)

## 2023-05-18 LAB — CBC
HCT: 42 % (ref 36.0–46.0)
Hemoglobin: 13.2 g/dL (ref 12.0–15.0)
MCH: 29 pg (ref 26.0–34.0)
MCHC: 31.4 g/dL (ref 30.0–36.0)
MCV: 92.3 fL (ref 80.0–100.0)
Platelets: 252 10*3/uL (ref 150–400)
RBC: 4.55 MIL/uL (ref 3.87–5.11)
RDW: 13.4 % (ref 11.5–15.5)
WBC: 8.3 10*3/uL (ref 4.0–10.5)
nRBC: 0 % (ref 0.0–0.2)

## 2023-05-18 LAB — BASIC METABOLIC PANEL
Anion gap: 14 (ref 5–15)
BUN: 9 mg/dL (ref 8–23)
CO2: 19 mmol/L — ABNORMAL LOW (ref 22–32)
Calcium: 7.8 mg/dL — ABNORMAL LOW (ref 8.9–10.3)
Chloride: 105 mmol/L (ref 98–111)
Creatinine, Ser: 0.55 mg/dL (ref 0.44–1.00)
GFR, Estimated: >60 mL/min (ref >60)
Glucose, Bld: 121 mg/dL — ABNORMAL HIGH (ref 70–99)
Potassium: 3.7 mmol/L (ref 3.5–5.1)
Sodium: 138 mmol/L (ref 135–145)

## 2023-05-18 MED ORDER — METOPROLOL TARTRATE 5 MG/5ML IV SOLN: 2.5000 mg | Freq: Three times a day (TID) | INTRAVENOUS | Status: DC | PRN

## 2023-05-18 MED ORDER — DEXAMETHASONE SODIUM PHOSPHATE 10 MG/ML IJ SOLN
8.0000 mg | Freq: Two times a day (BID) | INTRAMUSCULAR | Status: DC
  Administered 2023-05-18 – 2023-05-19 (×3): 8 mg via INTRAVENOUS
  Filled 2023-05-18 (×3): qty 1

## 2023-05-18 MED ORDER — PANTOPRAZOLE SODIUM 40 MG PO TBEC
40.0000 mg | DELAYED_RELEASE_TABLET | Freq: Every day | ORAL | Status: DC
  Administered 2023-05-19: 40 mg via ORAL
  Filled 2023-05-18: qty 1

## 2023-05-18 MED ORDER — IOHEXOL 300 MG/ML  SOLN
100.0000 mL | Freq: Once | INTRAMUSCULAR | Status: AC | PRN
  Administered 2023-05-18: 50 mL via ORAL

## 2023-05-18 MED ORDER — METOPROLOL SUCCINATE ER 25 MG PO TB24
25.0000 mg | ORAL_TABLET | Freq: Every day | ORAL | Status: DC
  Administered 2023-05-18 – 2023-05-19 (×2): 25 mg via ORAL
  Filled 2023-05-18 (×2): qty 1

## 2023-05-18 MED ORDER — SODIUM CHLORIDE 0.9% FLUSH: 3.0000 mL | INTRAVENOUS | Status: DC | PRN

## 2023-05-18 MED ORDER — SODIUM CHLORIDE 0.9% FLUSH
3.0000 mL | Freq: Two times a day (BID) | INTRAVENOUS | Status: DC
  Administered 2023-05-18 – 2023-05-19 (×2): 3 mL via INTRAVENOUS

## 2023-05-18 MED ORDER — SODIUM CHLORIDE 0.9 % IV SOLN: 250.0000 mL | INTRAVENOUS | Status: DC | PRN

## 2023-05-18 MED ORDER — HYDROCODONE-ACETAMINOPHEN 7.5-325 MG/15ML PO SOLN
10.0000 mL | ORAL | Status: DC | PRN
  Administered 2023-05-18 – 2023-05-19 (×3): 10 mL via ORAL
  Filled 2023-05-18 (×3): qty 15

## 2023-05-18 MED ORDER — ACETAMINOPHEN 160 MG/5ML PO SOLN
500.0000 mg | Freq: Four times a day (QID) | ORAL | Status: DC
  Administered 2023-05-18 – 2023-05-19 (×4): 500 mg via ORAL
  Filled 2023-05-18 (×4): qty 20.3

## 2023-05-18 MED ORDER — LACTATED RINGERS IV BOLUS: 1000.0000 mL | Freq: Three times a day (TID) | INTRAVENOUS | Status: DC | PRN

## 2023-05-18 NOTE — Progress Notes (Signed)
[facility] PROGRESS NOTE    Progress Note  Brenda Hunter  ION:[id number] DOB: 1935-08-14 DOA: 05/13/2023 PCP: Daisy Floro, MD     Brief Narrative:   Brenda Hunter is an 88 y.o. 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 Surgery on board, they recommended an esophagram today. If no leaks advance diet per surgery.  Surgery recommended PPI for 6 weeks. H. pylori results are pending. Continue mediastinal drain.  Essential hypertension: Continue metoprolol orally.  GERD/history of esophageal stricture: Continue Protonix twice a day.  Hypokalemia: Repleted now improved. Mag 1.9.     DVT prophylaxis: lovenox Family Communication:none Status is: Inpatient Remains inpatient appropriate because: Gastrografin obstruction due to a chronically incarcerated hiatal hernia    Code Status:     Code Status Orders  (From admission, onward)           Start     Ordered   05/14/23 0936  Full code  Continuous       Question:  By:  Answer:  Consent: discussion documented in EHR   05/14/23 0936           Code Status History     This patient has a current code status but no historical code status.         IV Access:   Peripheral IV   Procedures and diagnostic studies:   No results found.   Medical Consultants:   None.   Subjective:    Brenda Hunter tolerating her liquid is passing gas.  Objective:    Vitals:   05/17/23 2146 05/17/23 2314 05/18/23 0151 05/18/23 0608  BP: (!) 181/97 (!) 182/84 (!)  134/58 (!) 154/60  Pulse: 99 96 66 73  Resp: 18 20 18 17   Temp: (!) 97.5 F (36.4 C) (!) 97.4 F (36.3 C) (!) 97.5 F (36.4 C) 98.4 F (36.9 C)  TempSrc: Oral Oral Oral Oral  SpO2: 96% 96% 97% 94%  Weight:      Height:       SpO2: 94 % O2 Flow Rate (L/min): 2 L/min   Intake/Output Summary (Last 24 hours) at 05/18/2023 0922 Last data filed at 05/18/2023 0600 Gross per 24 hour  Intake 2941.67 ml  Output 2795 ml  Net 146.67 ml   Filed Weights   05/13/23 2234 05/17/23 1124  Weight: 68.5 kg 68.5 kg    Exam: General exam: In no acute distress. Respiratory system: Good air movement and clear to auscultation. Cardiovascular system: S1 & S2 heard, RRR. No JVD. Gastrointestinal system: Abdomen is nondistended, soft and nontender.  Extremities: No pedal edema. Skin: No rashes, lesions or ulcers Psychiatry: Judgement and insight appear normal. Mood & affect appropriate.    Data Reviewed:    Labs: Basic Metabolic Panel: Recent Labs  Lab 05/13/23 2237 05/15/23 0448 05/16/23 0003 05/17/23 0450 05/17/23 1823 05/18/23 0616  NA 137 139 140 139  --  138  K 4.1 3.7 3.1* 3.1*  --  3.7  CL 100 108 108 108  --  105  CO2 24 26 24 22   --  19*  GLUCOSE 161* 106* 93 86  --  121*  BUN 18 15 11  7*  --  9  CREATININE 0.61 0.60 0.54 0.51  --  0.55  CALCIUM 9.4 8.4* 8.0* 8.1*  --  7.8*  MG  --   --   --   --  1.7 1.9   GFR Estimated Creatinine Clearance: 47.7 mL/min (by C-G formula based on SCr of 0.55 mg/dL). Liver Function Tests: Recent Labs  Lab 05/13/23 2237 05/16/23 0003  AST 36 28  ALT 27 21  ALKPHOS 64 44  BILITOT 0.5 1.0  PROT 8.3* 6.0*  ALBUMIN 4.5 3.2*   Recent Labs  Lab 05/13/23 2237  LIPASE 131*   No results for input(s): "AMMONIA" in the last 168 hours. Coagulation profile No results for input(s): "INR", "PROTIME" in the last 168 hours. COVID-19 Labs  No results for input(s): "DDIMER", "FERRITIN", "LDH", "CRP" in the last 72 hours.  No results found  for: "SARSCOV2NAA"  CBC: Recent Labs  Lab 05/13/23 2311 05/15/23 0448 05/18/23 0616  WBC 10.4 11.7* 8.3  NEUTROABS 8.7*  --   --   HGB 15.4* 12.2 13.2  HCT 47.3* 39.7 42.0  MCV 89.2 93.2 92.3  PLT 308 228 252   Cardiac Enzymes: No results for input(s): "CKTOTAL", "CKMB", "CKMBINDEX", "TROPONINI" in the last 168 hours. BNP (last 3 results) No results for input(s): "PROBNP" in the last 8760 hours. CBG: No results for input(s): "GLUCAP" in the last 168 hours. D-Dimer: No results for input(s): "DDIMER" in the last 72 hours. Hgb A1c: No results for input(s): "HGBA1C" in the last 72 hours. Lipid Profile: No results for input(s): "CHOL", "HDL", "LDLCALC", "TRIG", "CHOLHDL", "LDLDIRECT" in the last 72 hours. Thyroid function studies: No results for input(s): "TSH", "T4TOTAL", "T3FREE", "THYROIDAB" in the last 72 hours.  Invalid input(s): "FREET3" Anemia work up: No results for input(s): "VITAMINB12", "FOLATE", "FERRITIN", "TIBC", "IRON", "RETICCTPCT" in the last 72 hours. Sepsis Labs: Recent Labs  Lab 05/13/23 2311 05/15/23 0448 05/18/23 0616  WBC 10.4 11.7* 8.3   Microbiology Recent Results (from the past 240 hour(s))  MRSA Next Gen by PCR, Nasal     Status: None   Collection Time: 05/16/23 11:06 PM   Specimen: Nasal Mucosa; Nasal Swab  Result Value Ref Range Status   MRSA by PCR Next Gen NOT DETECTED NOT DETECTED Final    Comment: (NOTE) The GeneXpert MRSA Assay (FDA approved for NASAL specimens only), is one component of a comprehensive MRSA colonization surveillance program. It is not intended to diagnose MRSA infection nor to guide or monitor treatment for MRSA infections. Test performance is not FDA approved in patients less than 80 years old. Performed at [facility], [address] [place], [place] [address]      Medications:    acetaminophen (TYLENOL) oral liquid 160 mg/5 mL  500 mg Oral Q6H   dexamethasone (DECADRON) injection  8  mg Intravenous Q12H   enoxaparin (LOVENOX) injection  40 mg Subcutaneous Q24H   metoprolol tartrate  2.5 mg Intravenous Q12H   pantoprazole (PROTONIX) IV  40 mg Intravenous Q12H   sodium chloride flush  3 mL Intravenous Q12H   Continuous Infusions:  sodium chloride     famotidine (PEPCID) IV 20 mg (05/17/23 2055)   lactated ringers     lactated ringers     methocarbamol (ROBAXIN) IV 1,000 mg (05/17/23 2322)   ondansetron (ZOFRAN) IV        LOS: 4 days   Marinda Elk  [facility]  05/18/2023, 9:22  AM

## 2023-05-18 NOTE — Progress Notes (Signed)
05/18/2023  Brenda Hunter [id number] [birth year]  CARE TEAM: PCP: Daisy Floro, MD  Outpatient Care Team: Patient Care Team: Daisy Floro, MD as PCP - General (Family Medicine) Jake Bathe, MD as PCP - Cardiology (Cardiology) Logan Bores Marveen Reeks, MD (Urology) Kathi Der, MD as Consulting Physician (Gastroenterology)  Inpatient Treatment Team: Treatment Team:  David Stall, Darin Engels, MD [facility], Md, MD Kathi Der, MD Lilyan Gilford, MD Karie Soda, MD [place], [place], [place], [place], [place]   Problem List:   Principal Problem:   Gastric outflow obstruction Active Problems:   Essential hypertension   LAFB (left anterior fascicular block)   Incarcerated hiatal hernia   Nausea & vomiting   Esophageal stricture   HOH (hard of hearing)   Hiatal hernia   GERD (gastroesophageal reflux disease)   Dysrhythmia   Arthritis   Peripheral edema   History of cameron gastric ulcer   Overweight   IDA (iron deficiency anemia)   05/17/2023  POST-OPERATIVE DIAGNOSIS:   INCARCERATED HIATAL HERNIA WITH GASTRIC OUTLET OBSTRUCTION GASTRIC MASS   PROCEDURE:   1. ROBOTIC reduction of paraesophageal hiatal hernia 2. Type II mediastinal dissection. 3. Primary repair of hiatal hernia over pledgets.  4. Anterior & posterior gastropexy. 5. Toupet (270 degree partial posterior x 4cm)  fundoplication 6. Mesh reinforcement with absorbable mesh 7. Excision of gastric mass   SURGEON:  Ardeth Sportsman, MD   OR FINDINGS:    Moderate-sized paraesophageal hiatal hernia with 50% of the stomach in the mediastinum.  There was a 10 x 7 cm hiatal defect.   It is a primary repair over pledgets.  Mesh reinforcement was used with PhasixT Mesh: a knitted monofilament mesh scaffold using Poly-4-hydroxybutyrate (P4HB), a biologically derived, fully resorbable material   The patient has a Toupet (240 degree partial posterior x 3cm)  fundoplication.  The patient has had  anterior and posterior gastropexy.    Assessment [facility] Stay = 4 days) 1 Day Post-Op    Recovering     Plan: Chronically incarcerated hiatal hernia with GOO - tol liquids - esophagram today POD#1  - if no leak/obstruction, then adv to Dys1 pureed diet - will need that for 2-3 weeks postop -if obstruction - NPO/sips, lasix, & decadron x 72hr - then re-eval - PPI until 6 weeks postop - awaiting Hpylori results - keep mediastinal drain for now - most likely can remove on day of d/c if does well - mobilize as tolerated    FEN: as above VTE: LMWH ID: rocephin periop 7/23   -Disposition:  Disposition:  The patient is from: Home Anticipate discharge to:  Home Anticipated Date of Discharge is:  July 25,2024   Barriers to discharge:  Transitions of Care, Need for inpatient procedure/study, and Pending Clinical improvement (more likely than not)  Patient currently is NOT MEDICALLY STABLE for discharge from the hospital from a surgery standpoint.      I reviewed nursing notes, hospitalist notes, last 24 h vitals and pain scores, last 48 h intake and output, last 24 h labs and trends, and last 24 h imaging results.  I have reviewed this patient's available data, including medical history, events of note, test results, etc as part of my evaluation.   A significant portion of that time was spent in counseling. Care during the described time interval was provided by me.  This care required moderate level of medical decision making.  05/18/2023    Subjective: (Chief complaint)  Feeling better Tol PO clears  No N/V/burping/hiccoughs Daughter at bedside Mild soreness at L ribcage & shoulder  Objective:  Vital signs:  Vitals:   05/17/23 2146 05/17/23 2314 05/18/23 0151 05/18/23 0608  BP: (!) 181/97 (!) 182/84 (!) 134/58 (!) 154/60  Pulse: 99 96 66 73  Resp: 18 20 18 17   Temp: (!) 97.5 F (36.4 C) (!) 97.4 F (36.3 C) (!) 97.5 F (36.4 C) 98.4 F (36.9 C)   TempSrc: Oral Oral Oral Oral  SpO2: 96% 96% 97% 94%  Weight:      Height:        Last BM Date : 05/13/23  Intake/Output   Yesterday:  07/23 0701 - 07/24 0700 In: 2941.7 [I.V.:2631.7; IV Piggyback:310] Out: 2795 [Urine:2700; Drains:20; Blood:75] This shift:  Total I/O In: 1241.7 [I.V.:1031.7; IV Piggyback:210] Out: 1470 [Urine:1450; Drains:20]  Bowel function:  Flatus: YES  BM:  No  Drain (19Fr Blake into mediastinum): Serosanguinous   Physical Exam:  General: Pt awake/alert in no acute distress.  Sitting up alert & chatty Eyes: PERRL, normal EOM.  Sclera clear.  No icterus Neuro: CN II-XII intact w/o focal sensory/motor deficits. Lymph: No head/neck/groin lymphadenopathy Psych:  No delerium/psychosis/paranoia.  Oriented x 4 HENT: Normocephalic, Mucus membranes moist.  No thrush Neck: Supple, No tracheal deviation.  No obvious thyromegaly Chest: Mild L rib soreness - no severe pain to chest wall compression.  Good respiratory excursion.  No audible wheezing CV:  Pulses intact.  Regular rhythm.  No major extremity edema MS: Normal AROM mjr joints.  No obvious deformity  Abdomen: Soft.  Nondistended.  Mildly tender at incisions only.  No evidence of peritonitis.  No incarcerated hernias.  Ext:  No deformity.  No mjr edema.  No cyanosis Skin: No petechiae / purpurea.  No major sores.  Warm and dry    Results:   Cultures: Recent Results (from the past 720 hour(s))  MRSA Next Gen by PCR, Nasal     Status: None   Collection Time: 05/16/23 11:06 PM   Specimen: Nasal Mucosa; Nasal Swab  Result Value Ref Range Status   MRSA by PCR Next Gen NOT DETECTED NOT DETECTED Final    Comment: (NOTE) The GeneXpert MRSA Assay (FDA approved for NASAL specimens only), is one component of a comprehensive MRSA colonization surveillance program. It is not intended to diagnose MRSA infection nor to guide or monitor treatment for MRSA infections. Test performance is not FDA approved  in patients less than 61 years old. Performed at [facility], [address] [place], [place] [address]     Labs: Results for orders placed or performed during the hospital encounter of 05/13/23 (from the past 48 hour(s))  MRSA Next Gen by PCR, Nasal     Status: None   Collection Time: 05/16/23 11:06 PM   Specimen: Nasal Mucosa; Nasal Swab  Result Value Ref Range   MRSA by PCR Next Gen NOT DETECTED NOT DETECTED    Comment: (NOTE) The GeneXpert MRSA Assay (FDA approved for NASAL specimens only), is one component of a comprehensive MRSA colonization surveillance program. It is not intended to diagnose MRSA infection nor to guide or monitor treatment for MRSA infections. Test performance is not FDA approved in patients less than 24 years old. Performed at [facility], [address], [place], [place] [address]   Basic metabolic panel     Status: Abnormal   Collection Time: 05/17/23  4:50 AM  Result Value Ref Range   Sodium 139 135 -  145 mmol/L   Potassium 3.1 (L) 3.5 - 5.1 mmol/L   Chloride 108 98 - 111 mmol/L   CO2 22 22 - 32 mmol/L   Glucose, Bld 86 70 - 99 mg/dL    Comment: Glucose reference range applies only to samples taken after fasting for at least 8 hours.   BUN 7 (L) 8 - 23 mg/dL   Creatinine, Ser 1.61 0.44 - 1.00 mg/dL   Calcium 8.1 (L) 8.9 - 10.3 mg/dL   GFR, Estimated >09 >60 mL/min    Comment: (NOTE) Calculated using the CKD-EPI Creatinine Equation (2021)    Anion gap 9 5 - 15    Comment: Performed at [facility], [address], [place], [place] [address]  Magnesium     Status: None   Collection Time: 05/17/23  6:23 PM  Result Value Ref Range   Magnesium 1.7 1.7 - 2.4 mg/dL    Comment: Performed at [facility], [address], [place], [place] [address]  CBC     Status: None   Collection Time: 05/18/23  6:16 AM  Result Value Ref Range   WBC 8.3 4.0 - 10.5 K/uL   RBC 4.55 3.87  - 5.11 MIL/uL   Hemoglobin 13.2 12.0 - 15.0 g/dL   HCT 47.8 29.5 - 62.1 %   MCV 92.3 80.0 - 100.0 fL   MCH 29.0 26.0 - 34.0 pg   MCHC 31.4 30.0 - 36.0 g/dL   RDW 30.8 65.7 - 84.6 %   Platelets 252 150 - 400 K/uL   nRBC 0.0 0.0 - 0.2 %    Comment: Performed at [facility], [address], [place], [place] [address]    Imaging / Studies: No results found.  Medications / Allergies: per chart  Antibiotics: Anti-infectives (From admission, onward)    Start     Dose/Rate Route Frequency Ordered Stop   05/17/23 1115  cefTRIAXone (ROCEPHIN) 2 g in sodium chloride 0.9 % 100 mL IVPB  Status:  Discontinued        2 g 200 mL/hr over 30 Minutes Intravenous On call to O.R. 05/17/23 1111 05/17/23 1113   05/17/23 0600  cefTRIAXone (ROCEPHIN) 2 g in sodium chloride 0.9 % 100 mL IVPB        2 g 200 mL/hr over 30 Minutes Intravenous On call to O.R. 05/15/23 1022 05/17/23 1418         Note: Portions of this report may have been transcribed using voice recognition software. Every effort was made to ensure accuracy; however, inadvertent computerized transcription errors may be present.   Any transcriptional errors that result from this process are unintentional.    Ardeth Sportsman, MD, FACS, MASCRS Esophageal, Gastrointestinal & Colorectal Surgery Robotic and Minimally Invasive Surgery  [facility] [address] [place], [place] [address] [phone] Fax [phone] Main  CONTACT INFORMATION: Weekday (9AM-5PM): Call [facility] main office at [phone] Weeknight (5PM-9AM) or Weekend/Holiday: Check EPIC "Web Links" tab & use "AMION" (password " [email or web address]") for General Surgery [facility] coverage  Please, DO NOT use SecureChat  (it is not reliable communication to reach operating surgeons & will lead to a delay in care).   Epic staff messaging available for outptient concerns needing 1-2 business day response.       05/18/2023  6:46 AM

## 2023-05-18 NOTE — Progress Notes (Signed)
PT Cancellation Note  Patient Details Name: Brenda Hunter MRN: [id number] DOB: Jan 28, 1935   Cancelled Treatment:    Reason Eval/Treat Not Completed:  Order received. Chart reviewed. Pt has already been mobilizing with family and mobility team. Mobility team indicates pt is Mod Ind. Secure chat with RN on today-pt has been walking halls with husband. Will sign off at this time.    Faye Ramsay, PT Acute Rehabilitation  Office: [phone]

## 2023-05-19 DIAGNOSIS — R748 Abnormal levels of other serum enzymes: Secondary | ICD-10-CM

## 2023-05-19 DIAGNOSIS — R1013 Epigastric pain: Secondary | ICD-10-CM | POA: Diagnosis not present

## 2023-05-19 DIAGNOSIS — K311 Adult hypertrophic pyloric stenosis: Secondary | ICD-10-CM | POA: Diagnosis not present

## 2023-05-19 DIAGNOSIS — K449 Diaphragmatic hernia without obstruction or gangrene: Secondary | ICD-10-CM | POA: Diagnosis not present

## 2023-05-19 MED ORDER — ONDANSETRON HCL 4 MG PO TABS: 4.0000 mg | ORAL_TABLET | Freq: Three times a day (TID) | ORAL | 10 refills | Status: DC | PRN

## 2023-05-19 MED ORDER — PANTOPRAZOLE SODIUM 40 MG PO TBEC: 40.0000 mg | DELAYED_RELEASE_TABLET | Freq: Every day | ORAL | 2 refills | Status: DC

## 2023-05-19 MED ORDER — ENSURE ENLIVE PO LIQD
237.0000 mL | Freq: Once | ORAL | Status: AC
  Administered 2023-05-19: 237 mL via ORAL

## 2023-05-19 MED ORDER — HYDROCODONE-ACETAMINOPHEN 5-325 MG PO TABS: 1.0000 | ORAL_TABLET | Freq: Four times a day (QID) | ORAL | 0 refills | Status: DC | PRN

## 2023-05-19 NOTE — Progress Notes (Signed)
[facility] PROGRESS NOTE    Progress Note  Brenda Hunter  WPY:[id number] DOB: 24-Sep-1935 DOA: 05/13/2023 PCP: Daisy Floro, MD     Brief Narrative:   Brenda Hunter is an 88 y.o. 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 Surgery on board,  esophagram Showed no evidence of leak.  Surgery recommended PPI for 6 weeks. H. pylori results are pending. Further management per surgery.  Hopefully can go home later on today.  Essential hypertension: Continue metoprolol orally.  GERD/history of esophageal stricture: Continue Protonix twice a day.  Hypokalemia: Repleted now improved. Mag 1.9.     DVT prophylaxis: lovenox Family Communication:none Status is: Inpatient Remains inpatient appropriate because: Gastrografin obstruction due to a chronically incarcerated hiatal hernia    Code Status:     Code Status Orders  (From admission, onward)           Start     Ordered   05/14/23 0936  Full code  Continuous       Question:  By:  Answer:  Consent: discussion documented in EHR   05/14/23 0936           Code Status History     This patient has a current code status but no historical code status.         IV Access:   Peripheral IV   Procedures and diagnostic studies:   DG UGI W SINGLE CM (SOL OR THIN BA)  Result Date: 05/18/2023 CLINICAL DATA:  [id number] S/P laparoscopic fundoplication [id number] EXAM: WATER SOLUBLE UPPER GI SERIES TECHNIQUE: Single-column upper GI series was performed using water soluble  contrast. Radiation Exposure Index (as provided by the fluoroscopic device): 37.1 mGy Kerma CONTRAST:  50 mL of Omni 300 COMPARISON:  Upper GI examination from 09/24/2021. FLUOROSCOPY: Fluoroscopy Time: 1 minute 6 seconds. Low-dose pulsed fluoroscopy was used. Radiation Exposure Index (if provided by the fluoroscopic device): 37.1 mGy. Number of Acquired Spot Images: 121. FINDINGS: No free air under the domes of diaphragm. There is blunting of left lateral costophrenic angle and left retrocardiac opacity, which may represent left lower lobe atelectasis and/or pneumonia with probable trace left pleural effusion. Right lung base and right lateral costophrenic angle are clear. Patient is status post laparoscopic fundoplication. No evidence of leak. There is normal passage of barium from the esophagus into the proximal stomach. No abnormal holdup. IMPRESSION: 1. Status post laparoscopic fundoplication. No evidence of leak. 2. Left lower lobe atelectasis and/or pneumonia with probable trace left pleural effusion. Electronically Signed   By: Jules Schick M.D.   On: 05/18/2023 10:57     Medical Consultants:   None.   Subjective:    Brenda Hunter tolerated her diet  Objective:    Vitals:   05/18/23 0934 05/18/23 1332 05/18/23 1950 05/19/23 0532  BP: (!) 171/74 (!) 166/71 (!) 143/63 (!) 162/66  Pulse: 87 80 87 82  Resp: 18 18 16 16   Temp: 98.2 F (36.8 C) 98.1 F (36.7 C) 98.1 F (36.7 C) 97.7 F (36.5 C)  TempSrc: Oral Oral Oral Oral  SpO2: 94% 96% 98% 96%  Weight:  Height:       SpO2: 96 % O2 Flow Rate (L/min): 2 L/min   Intake/Output Summary (Last 24 hours) at 05/19/2023 0905 Last data filed at 05/19/2023 0600 Gross per 24 hour  Intake 713.04 ml  Output 976 ml  Net -262.96 ml   Filed Weights   05/13/23 2234 05/17/23 1124  Weight: 68.5 kg 68.5 kg    Exam: General exam: In no acute distress. Respiratory system: Good air movement and clear to auscultation. Cardiovascular  system: S1 & S2 heard, RRR. No JVD. Gastrointestinal system: Abdomen is nondistended, soft and nontender.  Extremities: No pedal edema. Skin: No rashes, lesions or ulcers Psychiatry: Judgement and insight appear normal. Mood & affect appropriate.    Data Reviewed:    Labs: Basic Metabolic Panel: Recent Labs  Lab 05/13/23 2237 05/15/23 0448 05/16/23 0003 05/17/23 0450 05/17/23 1823 05/18/23 0616  NA 137 139 140 139  --  138  K 4.1 3.7 3.1* 3.1*  --  3.7  CL 100 108 108 108  --  105  CO2 24 26 24 22   --  19*  GLUCOSE 161* 106* 93 86  --  121*  BUN 18 15 11  7*  --  9  CREATININE 0.61 0.60 0.54 0.51  --  0.55  CALCIUM 9.4 8.4* 8.0* 8.1*  --  7.8*  MG  --   --   --   --  1.7 1.9   GFR Estimated Creatinine Clearance: 47.7 mL/min (by C-G formula based on SCr of 0.55 mg/dL). Liver Function Tests: Recent Labs  Lab 05/13/23 2237 05/16/23 0003  AST 36 28  ALT 27 21  ALKPHOS 64 44  BILITOT 0.5 1.0  PROT 8.3* 6.0*  ALBUMIN 4.5 3.2*   Recent Labs  Lab 05/13/23 2237  LIPASE 131*   No results for input(s): "AMMONIA" in the last 168 hours. Coagulation profile No results for input(s): "INR", "PROTIME" in the last 168 hours. COVID-19 Labs  No results for input(s): "DDIMER", "FERRITIN", "LDH", "CRP" in the last 72 hours.  No results found for: "SARSCOV2NAA"  CBC: Recent Labs  Lab 05/13/23 2311 05/15/23 0448 05/18/23 0616  WBC 10.4 11.7* 8.3  NEUTROABS 8.7*  --   --   HGB 15.4* 12.2 13.2  HCT 47.3* 39.7 42.0  MCV 89.2 93.2 92.3  PLT 308 228 252   Cardiac Enzymes: No results for input(s): "CKTOTAL", "CKMB", "CKMBINDEX", "TROPONINI" in the last 168 hours. BNP (last 3 results) No results for input(s): "PROBNP" in the last 8760 hours. CBG: No results for input(s): "GLUCAP" in the last 168 hours. D-Dimer: No results for input(s): "DDIMER" in the last 72 hours. Hgb A1c: No results for input(s): "HGBA1C" in the last 72 hours. Lipid Profile: No results for  input(s): "CHOL", "HDL", "LDLCALC", "TRIG", "CHOLHDL", "LDLDIRECT" in the last 72 hours. Thyroid function studies: No results for input(s): "TSH", "T4TOTAL", "T3FREE", "THYROIDAB" in the last 72 hours.  Invalid input(s): "FREET3" Anemia work up: No results for input(s): "VITAMINB12", "FOLATE", "FERRITIN", "TIBC", "IRON", "RETICCTPCT" in the last 72 hours. Sepsis Labs: Recent Labs  Lab 05/13/23 2311 05/15/23 0448 05/18/23 0616  WBC 10.4 11.7* 8.3   Microbiology Recent Results (from the past 240 hour(s))  MRSA Next Gen by PCR, Nasal     Status: None   Collection Time: 05/16/23 11:06 PM   Specimen: Nasal Mucosa; Nasal Swab  Result Value Ref Range Status   MRSA by PCR Next Gen NOT DETECTED NOT DETECTED Final    Comment: (NOTE)  The GeneXpert MRSA Assay (FDA approved for NASAL specimens only), is one component of a comprehensive MRSA colonization surveillance program. It is not intended to diagnose MRSA infection nor to guide or monitor treatment for MRSA infections. Test performance is not FDA approved in patients less than 72 years old. Performed at [facility], [address] [place], [place] [address]      Medications:    acetaminophen (TYLENOL) oral liquid 160 mg/5 mL  500 mg Oral Q6H   dexamethasone (DECADRON) injection  8 mg Intravenous Q12H   enoxaparin (LOVENOX) injection  40 mg Subcutaneous Q24H   metoprolol succinate  25 mg Oral Daily   pantoprazole  40 mg Oral Daily   sodium chloride flush  3 mL Intravenous Q12H   Continuous Infusions:  sodium chloride     lactated ringers     methocarbamol (ROBAXIN) IV 1,000 mg (05/17/23 2322)   ondansetron (ZOFRAN) IV        LOS: 5 days   Marinda Elk  [facility]  05/19/2023, 9:05 AM

## 2023-05-19 NOTE — Progress Notes (Addendum)
Nutrition Note  RD consulted for nutrition education regarding diet instructions s/p gastric fundoplication (pureed diet for 2-3 weeks post-op).    RD provided "IDDSI Level 4 Pureed [place]) Nutrition Therapy" handout from the [employer]. Reviewed patient's dietary recall. Provided examples on ways to modify foods on pureed diet. Encouraged pt to blend foods using a blender or food processor and strain to obtain desired consistency (no lumps). Also encouraged use of commercial pureed foods and oral nutritional supplements such as Ensure to provide adequate calories and protein. Discussed how to modify recipes to add calories and protein.    RD discussed why it is important for patient to adhere to diet recommendations and discussed possibility of diet advancement upon MD follow-up. Teach back method used.   Expect good compliance. Pt states she follows instructions and family was present for education. Will order Ensure supplement later today to support diet. Pt plans to drink vanilla Boost at home.   Body mass index is 32.78 kg/m. Pt meets criteria for obesity based on current BMI.   Current diet order is dysphagia 1.  Labs and medications reviewed. No further nutrition interventions warranted at this time. If additional nutrition issues arise, please re-consult RD.    Tilda Franco, MS, RD, LDN Inpatient Clinical Dietitian Contact information available via Amion

## 2023-05-19 NOTE — Discharge Summary (Signed)
Physician Discharge Summary  Brenda Hunter ZOX:[id number] DOB: 13-Jul-1935 DOA: 05/13/2023  PCP: Daisy Floro, MD  Admit date: 05/13/2023 Discharge date: 05/19/2023  Admitted From: Home Disposition:  Home  Recommendations for Outpatient Follow-up:  Follow up with PCP in 1-2 weeks Please obtain BMP/CBC in one week   Home Health:No Equipment/Devices:None  Discharge Condition:Stable CODE STATUS:Full Diet recommendation: Heart Healthy Brief/Interim Summary: 88 y.o. female past medical history significant for essential hypertension, GERD, esophageal stricture and hiatal hernia admitted to the hospital for incarcerated hiatal hernia, throughout her hospital stay she has had intermittent dysphagia and abdominal pain however she was able to tolerate her lunch few days ago   Discharge Diagnoses:  Principal Problem:   Gastric outflow obstruction Active Problems:   Essential hypertension   LAFB (left anterior fascicular block)   Incarcerated hiatal hernia   Nausea & vomiting   Esophageal stricture   HOH (hard of hearing)   Hiatal hernia   GERD (gastroesophageal reflux disease)   Dysrhythmia   Arthritis   Peripheral edema   History of cameron gastric ulcer   Overweight   IDA (iron deficiency anemia)  Chronically incarcerated hiatal hernia with gastric outlet obstruction: General surgery was consulted she status post robotic reduction of paraesophageal hiatal hernia on 05/17/2023. Surgery due to esophagogram the next day showed no evidence of leak. She was started on a diet which she tolerated. She will also go home on Protonix for 6 weeks. Follow-up with surgery as an outpatient.  Essential hypertension: No changes made to her medication continue current regimen.  GERD/history of esophageal stricture: Resume tonics twice a day.  Hypokalemia: Repleted as well as her magnesium.  Discharge Instructions  Discharge Instructions     Call MD for:   Complete by: As  directed    Temperature > 101.25F   Call MD for:  extreme fatigue   Complete by: As directed    Call MD for:  hives   Complete by: As directed    Call MD for:  persistant nausea and vomiting   Complete by: As directed    Call MD for:  redness, tenderness, or signs of infection (pain, swelling, redness, odor or green/yellow discharge around incision site)   Complete by: As directed    Call MD for:  severe uncontrolled pain   Complete by: As directed    Diet - low sodium heart healthy   Complete by: As directed    Diet general   Complete by: As directed    SEE ESOPHAGEAL SURGERY DIET INSTRUCTIONS  We using usually start you out on a pureed (blenderized) diet. Expect some sticking with swallowing over the next 1-2 months.   This is due to swelling around your esophagus at the wrap & hiatal diaphragm repair.  It will gradually ease off over the next few months.   Discharge instructions   Complete by: As directed    Please see discharge instruction sheets.   Also refer to any handouts/printouts that may have been given from the [facility] surgery office (if you visited [place] there before surgery) Please call our office if you have any questions or concerns [phone]   Driving Restrictions   Complete by: As directed    No driving until off narcotics and can safely swerve away without pain during an emergency   Increase activity slowly   Complete by: As directed    Increase activity slowly   Complete by: As directed    Lifting restrictions   Complete  by: As directed    Avoid heavy lifting initially, <20 pounds at first.   Do not push through pain.   You have no specific weight limit: If it hurts to do, DON'T DO IT.    If you feel no pain, you are not injuring anything.  Pain will protect you from injury.   Coughing and sneezing are far more stressful to your incision than any lifting.   Avoid resuming heavy lifting (>50 pounds) or other intense activity until off all narcotic pain  medications.   When want to exercise more, give yourself 2 weeks to gradually get back to full intense exercise/activity.   May shower / Bathe   Complete by: As directed    SHOWER EVERY DAY.  It is fine for dressings or wounds to be washed/rinsed.  Use gentle soap & water.  This will help the incisions and/or wounds get clean & minimize infection.   May walk up steps   Complete by: As directed    Remove dressing in 72 hours   Complete by: As directed    You have closed incisions: Shower and bathe over these incisions with soap and water every day.  It is OK to wash over the dressings: they are waterproof. Remove all surgical dressings on postoperative day #3.  You do not need to replace dressings over the closed incisions unless you feel more comfortable with a Band-Aid covering it.   TEGADERM:  You have clear gauze band-aid dressings over your closed incision(s).  Remove the dressings 3 days after surgery = Friday 7/26  Please call our office [phone] if you have further questions.   Sexual Activity Restrictions   Complete by: As directed    Sexual activity as tolerated.  Do not push through pain.  Pain will protect you from injury.   Walk with assistance   Complete by: As directed    Walk over an hour a day.  May use a walker/cane/companion to help with balance and stamina.      Allergies as of 05/19/2023       Reactions   Macrodantin [nitrofurantoin Macrocrystal] Hives   Nsaids Other (See Comments)   History of gastric ulcers   Oxycodone Palpitations        Medication List     TAKE these medications    cyanocobalamin 1000 MCG tablet Commonly known as: VITAMIN B12 Take 1,000 mcg by mouth daily.   methenamine 1 g tablet Commonly known as: HIPREX Take 1 g by mouth 2 (two) times daily.   metoprolol succinate 25 MG 24 hr tablet Commonly known as: TOPROL-XL TAKE ONE (1) TABLET BY MOUTH EACH DAY   omeprazole 20 MG tablet Commonly known as: PRILOSEC OTC Take 20 mg  by mouth daily before breakfast.   ondansetron 4 MG tablet Commonly known as: ZOFRAN Take 1 tablet (4 mg total) by mouth every 8 (eight) hours as needed for nausea.   oxybutynin 5 MG tablet Commonly known as: DITROPAN Take 5 mg by mouth daily as needed for bladder spasms.   pantoprazole 40 MG tablet Commonly known as: PROTONIX Take 1 tablet (40 mg total) by mouth daily. Start taking on: May 20, 2023   pyridOXINE 100 MG tablet Commonly known as: VITAMIN B6 Take 100 mg by mouth daily.        Follow-up Information     Karie Soda, MD. Go on 06/08/2023.   Specialties: General Surgery, Colon and Rectal Surgery Why: Your appointment is 06/08/23 arrive at 11:30am  Arrive early to check in, fill out paperwork, Designer, fashion/clothing ID and Insurance account manager information: [address] [place] [address] [phone]                Allergies  Allergen Reactions   Macrodantin [Nitrofurantoin Macrocrystal] Hives   Nsaids Other (See Comments)    History of gastric ulcers   Oxycodone Palpitations    Consultations: General surgery   Procedures/Studies: DG UGI W SINGLE CM (SOL OR THIN BA)  Result Date: 05/18/2023 CLINICAL DATA:  [id number] S/P laparoscopic fundoplication [id number] EXAM: WATER SOLUBLE UPPER GI SERIES TECHNIQUE: Single-column upper GI series was performed using water soluble contrast. Radiation Exposure Index (as provided by the fluoroscopic device): 37.1 mGy Kerma CONTRAST:  50 mL of Omni 300 COMPARISON:  Upper GI examination from 09/24/2021. FLUOROSCOPY: Fluoroscopy Time: 1 minute 6 seconds. Low-dose pulsed fluoroscopy was used. Radiation Exposure Index (if provided by the fluoroscopic device): 37.1 mGy. Number of Acquired Spot Images: 121. FINDINGS: No free air under the domes of diaphragm. There is blunting of left lateral costophrenic angle and left retrocardiac opacity, which may represent left lower lobe atelectasis and/or pneumonia with  probable trace left pleural effusion. Right lung base and right lateral costophrenic angle are clear. Patient is status post laparoscopic fundoplication. No evidence of leak. There is normal passage of barium from the esophagus into the proximal stomach. No abnormal holdup. IMPRESSION: 1. Status post laparoscopic fundoplication. No evidence of leak. 2. Left lower lobe atelectasis and/or pneumonia with probable trace left pleural effusion. Electronically Signed   By: Jules Schick M.D.   On: 05/18/2023 10:57   DG Abdomen 1 View  Result Date: 05/14/2023 CLINICAL DATA:  Nasogastric tube placement EXAM: ABDOMEN - 1 VIEW COMPARISON:  Abdominal CT from earlier today FINDINGS: An enteric tube reaches the subdiaphragmatic stomach, hiatal hernia is again noted with internal gas. No upper abdominal bowel distension. Clear lung bases in stable heart size. IMPRESSION: Enteric tube with tip and side-port reaching the subdiaphragmatic stomach. Electronically Signed   By: Tiburcio Pea M.D.   On: 05/14/2023 04:26   CT ABDOMEN PELVIS W CONTRAST  Result Date: 05/14/2023 CLINICAL DATA:  Left upper quadrant abdominal pain. EXAM: CT ABDOMEN AND PELVIS WITH CONTRAST TECHNIQUE: Multidetector CT imaging of the abdomen and pelvis was performed using the standard protocol following bolus administration of intravenous contrast. RADIATION DOSE REDUCTION: This exam was performed according to the departmental dose-optimization program which includes automated exposure control, adjustment of the mA and/or kV according to patient size and/or use of iterative reconstruction technique. CONTRAST:  OMNIPAQUE IOHEXOL 300 MG/ML  SOLN COMPARISON:  Abdominal CT dated 12/30/2008. FINDINGS: Lower chest: The visualized lung bases are clear. No intra-abdominal free air or free fluid. Hepatobiliary: Fatty liver. A 1.6 cm enhancing focus in the caudate lobe (17/2) was present on the prior CT of 12/30/2008, likely portal venous shunting or  flash filling hemangioma. No imaging follow-up. No biliary dilatation. The gallbladder is unremarkable. Pancreas: Unremarkable. No pancreatic ductal dilatation or surrounding inflammatory changes. Spleen: Normal in size without focal abnormality. Adrenals/Urinary Tract: The adrenal glands unremarkable. Small bilateral renal cysts as well as subcentimeter hypodense lesions which are too small to characterize. There is no hydronephrosis on either side. There is symmetric enhancement and excretion of contrast by both kidneys. The visualized ureters and urinary bladder appear unremarkable. Stomach/Bowel: There is a large hiatal hernia containing a portion of the stomach. There is partially visualized mild inflammatory changes surrounding  the herniated stomach concerning for a degree of obstruction or gastric volvulus. There is sigmoid diverticulosis without active inflammatory changes. No evidence of small-bowel obstruction. The appendix is normal. Vascular/Lymphatic: Mild aortoiliac atherosclerotic disease. The IVC is unremarkable. No portal venous gas. There is no adenopathy. Reproductive: Hysterectomy.  No adnexal masses. Other: None Musculoskeletal: Degenerative changes of the spine. No acute osseous pathology. IMPRESSION: 1. Large hiatal hernia containing a portion of the stomach with findings concerning for a degree of obstruction or gastric volvulus. 2. Sigmoid diverticulosis. No bowel obstruction. Normal appendix. 3. Fatty liver. 4.  Aortic Atherosclerosis (ICD10-I70.0). Electronically Signed   By: Elgie Collard M.D.   On: 05/14/2023 02:15   (Echo, Carotid, EGD, Colonoscopy, ERCP)    Subjective: No complaints  Discharge Exam: Vitals:   05/18/23 1950 05/19/23 0532  BP: (!) 143/63 (!) 162/66  Pulse: 87 82  Resp: 16 16  Temp: 98.1 F (36.7 C) 97.7 F (36.5 C)  SpO2: 98% 96%   Vitals:   05/18/23 0934 05/18/23 1332 05/18/23 1950 05/19/23 0532  BP: (!) 171/74 (!) 166/71 (!) 143/63 (!) 162/66   Pulse: 87 80 87 82  Resp: 18 18 16 16   Temp: 98.2 F (36.8 C) 98.1 F (36.7 C) 98.1 F (36.7 C) 97.7 F (36.5 C)  TempSrc: Oral Oral Oral Oral  SpO2: 94% 96% 98% 96%  Weight:      Height:        General: Pt is alert, awake, not in acute distress Cardiovascular: RRR, S1/S2 +, no rubs, no gallops Respiratory: CTA bilaterally, no wheezing, no rhonchi Abdominal: Soft, NT, ND, bowel sounds + Extremities: no edema, no cyanosis    The results of significant diagnostics from this hospitalization (including imaging, microbiology, ancillary and laboratory) are listed below for reference.     Microbiology: Recent Results (from the past 240 hour(s))  MRSA Next Gen by PCR, Nasal     Status: None   Collection Time: 05/16/23 11:06 PM   Specimen: Nasal Mucosa; Nasal Swab  Result Value Ref Range Status   MRSA by PCR Next Gen NOT DETECTED NOT DETECTED Final    Comment: (NOTE) The GeneXpert MRSA Assay (FDA approved for NASAL specimens only), is one component of a comprehensive MRSA colonization surveillance program. It is not intended to diagnose MRSA infection nor to guide or monitor treatment for MRSA infections. Test performance is not FDA approved in patients less than 54 years old. Performed at [facility], [address] [place], [place] [address]      Labs: BNP (last 3 results) No results for input(s): "BNP" in the last 8760 hours. Basic Metabolic Panel: Recent Labs  Lab 05/13/23 2237 05/15/23 0448 05/16/23 0003 05/17/23 0450 05/17/23 1823 05/18/23 0616  NA 137 139 140 139  --  138  K 4.1 3.7 3.1* 3.1*  --  3.7  CL 100 108 108 108  --  105  CO2 24 26 24 22   --  19*  GLUCOSE 161* 106* 93 86  --  121*  BUN 18 15 11  7*  --  9  CREATININE 0.61 0.60 0.54 0.51  --  0.55  CALCIUM 9.4 8.4* 8.0* 8.1*  --  7.8*  MG  --   --   --   --  1.7 1.9   Liver Function Tests: Recent Labs  Lab 05/13/23 2237 05/16/23 0003  AST 36 28  ALT 27 21  ALKPHOS 64  44  BILITOT 0.5 1.0  PROT 8.3* 6.0*  ALBUMIN 4.5 3.2*   Recent Labs  Lab 05/13/23 2237  LIPASE 131*   No results for input(s): "AMMONIA" in the last 168 hours. CBC: Recent Labs  Lab 05/13/23 2311 05/15/23 0448 05/18/23 0616  WBC 10.4 11.7* 8.3  NEUTROABS 8.7*  --   --   HGB 15.4* 12.2 13.2  HCT 47.3* 39.7 42.0  MCV 89.2 93.2 92.3  PLT 308 228 252   Cardiac Enzymes: No results for input(s): "CKTOTAL", "CKMB", "CKMBINDEX", "TROPONINI" in the last 168 hours. BNP: Invalid input(s): "POCBNP" CBG: No results for input(s): "GLUCAP" in the last 168 hours. D-Dimer No results for input(s): "DDIMER" in the last 72 hours. Hgb A1c No results for input(s): "HGBA1C" in the last 72 hours. Lipid Profile No results for input(s): "CHOL", "HDL", "LDLCALC", "TRIG", "CHOLHDL", "LDLDIRECT" in the last 72 hours. Thyroid function studies No results for input(s): "TSH", "T4TOTAL", "T3FREE", "THYROIDAB" in the last 72 hours.  Invalid input(s): "FREET3" Anemia work up No results for input(s): "VITAMINB12", "FOLATE", "FERRITIN", "TIBC", "IRON", "RETICCTPCT" in the last 72 hours. Urinalysis    Component Value Date/Time   COLORURINE YELLOW 05/14/2023 1821   APPEARANCEUR HAZY (A) 05/14/2023 1821   LABSPEC 1.024 05/14/2023 1821   PHURINE 5.0 05/14/2023 1821   GLUCOSEU NEGATIVE 05/14/2023 1821   HGBUR LARGE (A) 05/14/2023 1821   BILIRUBINUR NEGATIVE 05/14/2023 1821   KETONESUR NEGATIVE 05/14/2023 1821   PROTEINUR 30 (A) 05/14/2023 1821   NITRITE NEGATIVE 05/14/2023 1821   LEUKOCYTESUR NEGATIVE 05/14/2023 1821   Sepsis Labs Recent Labs  Lab 05/13/23 2311 05/15/23 0448 05/18/23 0616  WBC 10.4 11.7* 8.3   Microbiology Recent Results (from the past 240 hour(s))  MRSA Next Gen by PCR, Nasal     Status: None   Collection Time: 05/16/23 11:06 PM   Specimen: Nasal Mucosa; Nasal Swab  Result Value Ref Range Status   MRSA by PCR Next Gen NOT DETECTED NOT DETECTED Final    Comment:  (NOTE) The GeneXpert MRSA Assay (FDA approved for NASAL specimens only), is one component of a comprehensive MRSA colonization surveillance program. It is not intended to diagnose MRSA infection nor to guide or monitor treatment for MRSA infections. Test performance is not FDA approved in patients less than 36 years old. Performed at [facility], [address], [place], [place] [address]      SIGNED:   Marinda Elk, MD  [facility] 05/19/2023, 10:42 AM Pager   If 7PM-7AM, please contact night-coverage [email or web address] Password [email or web address]

## 2023-05-19 NOTE — Plan of Care (Signed)

## 2023-05-19 NOTE — Progress Notes (Signed)
05/19/2023  Brenda Hunter [id number] 04-18-1935  CARE TEAM: PCP: Daisy Floro, MD  Outpatient Care Team: Patient Care Team: Daisy Floro, MD as PCP - General (Family Medicine) Jake Bathe, MD as PCP - Cardiology (Cardiology) Jamison Neighbor, MD (Urology) Kathi Der, MD as Consulting Physician (Gastroenterology)  Inpatient Treatment Team: Treatment Team:  David Stall, Darin Engels, MD [facility], Md, MD Kathi Der, MD Lilyan Gilford, MD Karie Soda, MD Alta Corning Eli Phillips, RN Norva Pavlov, [facility]   Problem List:   Principal Problem:   Gastric outflow obstruction Active Problems:   Essential hypertension   LAFB (left anterior fascicular block)   Incarcerated hiatal hernia   Nausea & vomiting   Esophageal stricture   HOH (hard of hearing)   Hiatal hernia   GERD (gastroesophageal reflux disease)   Dysrhythmia   Arthritis   Peripheral edema   History of cameron gastric ulcer   Overweight   IDA (iron deficiency anemia)   05/17/2023  POST-OPERATIVE DIAGNOSIS:   INCARCERATED HIATAL HERNIA WITH GASTRIC OUTLET OBSTRUCTION GASTRIC MASS   PROCEDURE:   1. ROBOTIC reduction of paraesophageal hiatal hernia 2. Type II mediastinal dissection. 3. Primary repair of hiatal hernia over pledgets.  4. Anterior & posterior gastropexy. 5. Toupet (270 degree partial posterior x 4cm)  fundoplication 6. Mesh reinforcement with absorbable mesh 7. Excision of gastric mass   SURGEON:  Ardeth Sportsman, MD   OR FINDINGS:    Moderate-sized paraesophageal hiatal hernia with 50% of the stomach in the mediastinum.  There was a 10 x 7 cm hiatal defect.   It is a primary repair over pledgets.  Mesh reinforcement was used with PhasixT Mesh: a knitted monofilament mesh scaffold using Poly-4-hydroxybutyrate (P4HB), a biologically derived, fully resorbable material   The patient has a Toupet (240 degree partial posterior x 3cm)  fundoplication.  The patient has had  anterior and posterior gastropexy.    Assessment [facility] Stay = 5 days) 2 Days Post-Op    Recovering     Plan: Chronically incarcerated hiatal hernia with GOO - tol Dys1 diet - d/c w that diet for next 2-3 weeks, then ADAT - esophageal surgery instructions & nutrition education given - PPI daily until 6 weeks postop - she has home Prilosec/omeprazole - awaiting Hpylori results - d/c mediastinal drain  - mobilize as tolerated    FEN: as above VTE: LMWH ID: rocephin periop 7/23   -Disposition: OK to d/c home from surgery standpoint Disposition:  The patient is from: Home Anticipate discharge to:  Home Anticipated Date of Discharge is:  July 25,2024   Barriers to discharge:  Transitions of Care, Need for inpatient procedure/study, and Pending Clinical improvement (more likely than not)  Patient currently is NOT MEDICALLY STABLE for discharge from the hospital from a surgery standpoint.      I reviewed nursing notes, hospitalist notes, last 24 h vitals and pain scores, last 48 h intake and output, last 24 h labs and trends, and last 24 h imaging results.  I have reviewed this patient's available data, including medical history, events of note, test results, etc as part of my evaluation.   A significant portion of that time was spent in counseling. Care during the described time interval was provided by me.  This care required moderate level of medical decision making.  05/19/2023    Subjective: (Chief complaint)  Feeling better Tol PO Dys1 pureed diet Walking better No shoulder pain - mild thoracic spinal & ribcage soreness  w deep breaths No N/V/burping/hiccoughs   Objective:  Vital signs:  Vitals:   05/18/23 0934 05/18/23 1332 05/18/23 1950 05/19/23 0532  BP: (!) 171/74 (!) 166/71 (!) 143/63 (!) 162/66  Pulse: 87 80 87 82  Resp: 18 18 16 16   Temp: 98.2 F (36.8 C) 98.1 F (36.7 C) 98.1 F (36.7 C) 97.7 F (36.5 C)  TempSrc: Oral Oral Oral Oral   SpO2: 94% 96% 98% 96%  Weight:      Height:        Last BM Date : 05/18/23  Intake/Output   Yesterday:  07/24 0701 - 07/25 0700 In: 713 [P.O.:660; I.V.:3; IV Piggyback:50] Out: 976 [Urine:451; Drains:525] This shift:  No intake/output data recorded.  Bowel function:  Flatus: YES  BM:  No  Drain (19Fr Blake into mediastinum): Serosanguinous   Physical Exam:  General: Pt awake/alert in no acute distress.  Sitting up alert & chatty Eyes: PERRL, normal EOM.  Sclera clear.  No icterus Neuro: CN II-XII intact w/o focal sensory/motor deficits. Lymph: No head/neck/groin lymphadenopathy Psych:  No delerium/psychosis/paranoia.  Oriented x 4 HENT: Normocephalic, Mucus membranes moist.  No thrush Neck: Supple, No tracheal deviation.  No obvious thyromegaly Chest: Mild L rib soreness - no severe pain to chest wall compression.  Good respiratory excursion.  No audible wheezing CV:  Pulses intact.  Regular rhythm.  No major extremity edema MS: Normal AROM mjr joints.  No obvious deformity  Abdomen: Soft.  Nondistended.  Mildly tender at incisions only.  No evidence of peritonitis.  No incarcerated hernias.  Ext:  No deformity.  No mjr edema.  No cyanosis Skin: No petechiae / purpurea.  No major sores.  Warm and dry    Results:   Cultures: Recent Results (from the past 720 hour(s))  MRSA Next Gen by PCR, Nasal     Status: None   Collection Time: 05/16/23 11:06 PM   Specimen: Nasal Mucosa; Nasal Swab  Result Value Ref Range Status   MRSA by PCR Next Gen NOT DETECTED NOT DETECTED Final    Comment: (NOTE) The GeneXpert MRSA Assay (FDA approved for NASAL specimens only), is one component of a comprehensive MRSA colonization surveillance program. It is not intended to diagnose MRSA infection nor to guide or monitor treatment for MRSA infections. Test performance is not FDA approved in patients less than 79 years old. Performed at [facility], [address], [place], [place] [address]     Labs: Results for orders placed or performed during the hospital encounter of 05/13/23 (from the past 48 hour(s))  Magnesium     Status: None   Collection Time: 05/17/23  6:23 PM  Result Value Ref Range   Magnesium 1.7 1.7 - 2.4 mg/dL    Comment: Performed at [facility], [address], [place], [place] [address]  CBC     Status: None   Collection Time: 05/18/23  6:16 AM  Result Value Ref Range   WBC 8.3 4.0 - 10.5 K/uL   RBC 4.55 3.87 - 5.11 MIL/uL   Hemoglobin 13.2 12.0 - 15.0 g/dL   HCT 52.8 41.3 - 24.4 %   MCV 92.3 80.0 - 100.0 fL   MCH 29.0 26.0 - 34.0 pg   MCHC 31.4 30.0 - 36.0 g/dL   RDW 01.0 27.2 - 53.6 %   Platelets 252 150 - 400 K/uL   nRBC 0.0 0.0 - 0.2 %    Comment: Performed at [facility],  [address] [place], [place] [address]  Basic metabolic panel     Status: Abnormal   Collection Time: 05/18/23  6:16 AM  Result Value Ref Range   Sodium 138 135 - 145 mmol/L   Potassium 3.7 3.5 - 5.1 mmol/L   Chloride 105 98 - 111 mmol/L   CO2 19 (L) 22 - 32 mmol/L   Glucose, Bld 121 (H) 70 - 99 mg/dL    Comment: Glucose reference range applies only to samples taken after fasting for at least 8 hours.   BUN 9 8 - 23 mg/dL   Creatinine, Ser 9.14 0.44 - 1.00 mg/dL   Calcium 7.8 (L) 8.9 - 10.3 mg/dL   GFR, Estimated >78 >29 mL/min    Comment: (NOTE) Calculated using the CKD-EPI Creatinine Equation (2021)    Anion gap 14 5 - 15    Comment: Performed at [facility], [address], [place], [place] [address]  Magnesium     Status: None   Collection Time: 05/18/23  6:16 AM  Result Value Ref Range   Magnesium 1.9 1.7 - 2.4 mg/dL    Comment: Performed at [facility], [address] [place], [place] [address]    Imaging / Studies: DG UGI W SINGLE CM (SOL OR THIN BA)  Result Date: 05/18/2023 CLINICAL DATA:  [id number] S/P laparoscopic fundoplication [id number] EXAM:  WATER SOLUBLE UPPER GI SERIES TECHNIQUE: Single-column upper GI series was performed using water soluble contrast. Radiation Exposure Index (as provided by the fluoroscopic device): 37.1 mGy Kerma CONTRAST:  50 mL of Omni 300 COMPARISON:  Upper GI examination from 09/24/2021. FLUOROSCOPY: Fluoroscopy Time: 1 minute 6 seconds. Low-dose pulsed fluoroscopy was used. Radiation Exposure Index (if provided by the fluoroscopic device): 37.1 mGy. Number of Acquired Spot Images: 121. FINDINGS: No free air under the domes of diaphragm. There is blunting of left lateral costophrenic angle and left retrocardiac opacity, which may represent left lower lobe atelectasis and/or pneumonia with probable trace left pleural effusion. Right lung base and right lateral costophrenic angle are clear. Patient is status post laparoscopic fundoplication. No evidence of leak. There is normal passage of barium from the esophagus into the proximal stomach. No abnormal holdup. IMPRESSION: 1. Status post laparoscopic fundoplication. No evidence of leak. 2. Left lower lobe atelectasis and/or pneumonia with probable trace left pleural effusion. Electronically Signed   By: Jules Schick M.D.   On: 05/18/2023 10:57    Medications / Allergies: per chart  Antibiotics: Anti-infectives (From admission, onward)    Start     Dose/Rate Route Frequency Ordered Stop   05/17/23 1115  cefTRIAXone (ROCEPHIN) 2 g in sodium chloride 0.9 % 100 mL IVPB  Status:  Discontinued        2 g 200 mL/hr over 30 Minutes Intravenous On call to O.R. 05/17/23 1111 05/17/23 1113   05/17/23 0600  cefTRIAXone (ROCEPHIN) 2 g in sodium chloride 0.9 % 100 mL IVPB        2 g 200 mL/hr over 30 Minutes Intravenous On call to O.R. 05/15/23 1022 05/17/23 1418         Note: Portions of this report may have been transcribed using voice recognition software. Every effort was made to ensure accuracy; however, inadvertent computerized transcription errors may be present.    Any transcriptional errors that result from this process are unintentional.    Ardeth Sportsman, MD, FACS, MASCRS Esophageal, Gastrointestinal & Colorectal Surgery Robotic and Minimally Invasive Surgery  [facility]  Surgery [facility] [address] [place], [place] [address] [phone] Fax [phone] Main  CONTACT INFORMATION: Weekday (9AM-5PM): Call [facility] main office at [phone] Weeknight (5PM-9AM) or Weekend/Holiday: Check EPIC "Web Links" tab & use "AMION" (password " [email or web address]") for General Surgery [facility] coverage  Please, DO NOT use SecureChat  (it is not reliable communication to reach operating surgeons & will lead to a delay in care).   Epic staff messaging available for outptient concerns needing 1-2 business day response.      05/19/2023  8:07 AM

## 2023-05-19 NOTE — Progress Notes (Signed)
Patient was given discharge instructions, and all questions were answered.  Patient was stable for discharge and was taken to the main exit by wheelchair. 

## 2023-06-17 DIAGNOSIS — Z6826 Body mass index (BMI) 26.0-26.9, adult: Secondary | ICD-10-CM | POA: Diagnosis not present

## 2023-06-17 DIAGNOSIS — M549 Dorsalgia, unspecified: Secondary | ICD-10-CM | POA: Diagnosis not present

## 2023-06-17 DIAGNOSIS — Z09 Encounter for follow-up examination after completed treatment for conditions other than malignant neoplasm: Secondary | ICD-10-CM | POA: Diagnosis not present

## 2023-06-17 DIAGNOSIS — K449 Diaphragmatic hernia without obstruction or gangrene: Secondary | ICD-10-CM | POA: Diagnosis not present

## 2023-06-17 DIAGNOSIS — R899 Unspecified abnormal finding in specimens from other organs, systems and tissues: Secondary | ICD-10-CM | POA: Diagnosis not present

## 2023-08-04 ENCOUNTER — Encounter: Payer: Self-pay | Admitting: Cardiology

## 2023-08-04 ENCOUNTER — Ambulatory Visit: Payer: Medicare Other | Attending: Cardiology | Admitting: Cardiology

## 2023-08-04 VITALS — BP 126/70 | Ht 63.0 in | Wt 142.4 lb

## 2023-08-04 DIAGNOSIS — I444 Left anterior fascicular block: Secondary | ICD-10-CM | POA: Diagnosis not present

## 2023-08-04 DIAGNOSIS — I10 Essential (primary) hypertension: Secondary | ICD-10-CM | POA: Diagnosis not present

## 2023-08-04 DIAGNOSIS — R002 Palpitations: Secondary | ICD-10-CM | POA: Diagnosis not present

## 2023-08-04 NOTE — Patient Instructions (Signed)

## 2023-08-04 NOTE — Progress Notes (Signed)
Cardiology Office Note:  .   Date:  08/04/2023  ID:  Brenda Hunter, DOB 1934-11-18, MRN [id number] PCP: Daisy Floro, MD  [facility] Providers Cardiologist:  Donato Schultz, MD     History of Present Illness: [name]   Brenda Hunter is a 88 y.o. female Discussed with the use of AI scribe   History of Present Illness   The patient, an 88 year old with a history of hypertension, unifocal PVCs, and a recent hiatal hernia repair, presents for a follow-up visit. She reports a significant improvement in her quality of life following the hiatal hernia repair, which was performed robotically. Prior to the surgery, the patient experienced internal bleeding, difficulty eating, and persistent abdominal pain. The patient's stomach was found to be inverted and located in the chest cavity during the surgery, which was corrected and reinforced with mesh to prevent recurrence.  Postoperatively, the patient experienced difficulty eating, but reports no pain and has since been able to discontinue her use of Prilosec. She expresses satisfaction with the outcome of the surgery and reports an improvement in her eating habits.  The patient also reports a brief period of palpitations or "extra beats" lasting two to three days. This was associated with elevated blood pressure readings during office visits.  The patient has a history of multiple surgeries, including procedures on her shoulder, knee, and foot, all on the same side of the body. The most challenging recovery was reported to be from the hiatal hernia repair due to its internal nature. Despite this, the patient would recommend the procedure to others experiencing similar symptoms.          ROS: No CP, no SOB  Studies Reviewed: .        Results LABS LDL: 109 Creatinine: 0.58 Hemoglobin: 13.5 A1c: 5.9  DIAGNOSTIC Echocardiogram: Ejection fraction 65%, trivial mitral regurgitation, mild tricuspid regurgitation (2017) Holter monitor:  Very rare PVCs and PACs, no atrial fibrillation (2017) EKG: Sinus rhythm with left anterior fascicular block and poor R-wave progression  Risk Assessment/Calculations:            Physical Exam:   VS:  BP 126/70   Ht 5\' 3"  (1.6 m)   Wt 142 lb 6.4 oz (64.6 kg)   SpO2 96%   BMI 25.23 kg/m    Wt Readings from Last 3 Encounters:  08/04/23 142 lb 6.4 oz (64.6 kg)  05/17/23 151 lb (68.5 kg)  05/27/22 152 lb (68.9 kg)    GEN: Well nourished, well developed in no acute distress NECK: No JVD; No carotid bruits CARDIAC: RRR, no murmurs, no rubs, no gallops RESPIRATORY:  Clear to auscultation without rales, wheezing or rhonchi  ABDOMEN: Soft, non-tender, non-distended EXTREMITIES:  No edema; No deformity   ASSESSMENT AND PLAN: .       Hypertension Well controlled on Toprol 50mg  daily. No changes to medication regimen. -Continue Toprol 50mg  daily.  Premature Ventricular Contractions (PVCs) Rare PVCs noted on prior Holter monitor in 2017. No recent complaints of palpitations. -Continue current management.  Hiatal Hernia Successful robotic surgery with mesh placement. Improved quality of life post-surgery with no current complaints. -No further action required.  General Health Maintenance LDL 109, Creatinine 0.58, Hemoglobin 13.5, A1C 5.9. All within acceptable ranges. -Continue current lifestyle and medication regimen. -Follow-up in 1 year.               Signed, Donato Schultz, MD

## 2023-09-16 DIAGNOSIS — H353132 Nonexudative age-related macular degeneration, bilateral, intermediate dry stage: Secondary | ICD-10-CM | POA: Diagnosis not present

## 2023-09-16 DIAGNOSIS — H35363 Drusen (degenerative) of macula, bilateral: Secondary | ICD-10-CM | POA: Diagnosis not present

## 2023-09-16 DIAGNOSIS — H40013 Open angle with borderline findings, low risk, bilateral: Secondary | ICD-10-CM | POA: Diagnosis not present

## 2023-09-16 DIAGNOSIS — H04123 Dry eye syndrome of bilateral lacrimal glands: Secondary | ICD-10-CM | POA: Diagnosis not present

## 2023-09-27 DIAGNOSIS — Z23 Encounter for immunization: Secondary | ICD-10-CM | POA: Diagnosis not present

## 2023-11-02 DIAGNOSIS — L820 Inflamed seborrheic keratosis: Secondary | ICD-10-CM | POA: Diagnosis not present

## 2023-11-02 DIAGNOSIS — L570 Actinic keratosis: Secondary | ICD-10-CM | POA: Diagnosis not present

## 2023-12-05 DIAGNOSIS — M1711 Unilateral primary osteoarthritis, right knee: Secondary | ICD-10-CM | POA: Diagnosis not present

## 2023-12-12 DIAGNOSIS — L820 Inflamed seborrheic keratosis: Secondary | ICD-10-CM | POA: Diagnosis not present

## 2023-12-12 DIAGNOSIS — Z129 Encounter for screening for malignant neoplasm, site unspecified: Secondary | ICD-10-CM | POA: Diagnosis not present

## 2023-12-12 DIAGNOSIS — L821 Other seborrheic keratosis: Secondary | ICD-10-CM | POA: Diagnosis not present

## 2023-12-12 DIAGNOSIS — Z85828 Personal history of other malignant neoplasm of skin: Secondary | ICD-10-CM | POA: Diagnosis not present

## 2023-12-12 DIAGNOSIS — D485 Neoplasm of uncertain behavior of skin: Secondary | ICD-10-CM | POA: Diagnosis not present

## 2024-01-09 DIAGNOSIS — M1711 Unilateral primary osteoarthritis, right knee: Secondary | ICD-10-CM | POA: Diagnosis not present

## 2024-01-16 DIAGNOSIS — M1711 Unilateral primary osteoarthritis, right knee: Secondary | ICD-10-CM | POA: Diagnosis not present

## 2024-01-19 DIAGNOSIS — R413 Other amnesia: Secondary | ICD-10-CM | POA: Diagnosis not present

## 2024-01-19 DIAGNOSIS — Z6828 Body mass index (BMI) 28.0-28.9, adult: Secondary | ICD-10-CM | POA: Diagnosis not present

## 2024-01-19 DIAGNOSIS — I10 Essential (primary) hypertension: Secondary | ICD-10-CM | POA: Diagnosis not present

## 2024-01-19 DIAGNOSIS — M179 Osteoarthritis of knee, unspecified: Secondary | ICD-10-CM | POA: Diagnosis not present

## 2024-01-19 DIAGNOSIS — R635 Abnormal weight gain: Secondary | ICD-10-CM | POA: Diagnosis not present

## 2024-01-22 ENCOUNTER — Other Ambulatory Visit: Payer: Self-pay

## 2024-01-22 ENCOUNTER — Encounter (HOSPITAL_BASED_OUTPATIENT_CLINIC_OR_DEPARTMENT_OTHER): Payer: Self-pay

## 2024-01-22 ENCOUNTER — Emergency Department (HOSPITAL_BASED_OUTPATIENT_CLINIC_OR_DEPARTMENT_OTHER)
Admission: EM | Admit: 2024-01-22 | Discharge: 2024-01-22 | Disposition: A | Discharge status: Home / Self Care | Attending: Emergency Medicine | Admitting: Emergency Medicine

## 2024-01-22 DIAGNOSIS — M25561 Pain in right knee: Secondary | ICD-10-CM | POA: Diagnosis not present

## 2024-01-22 DIAGNOSIS — I10 Essential (primary) hypertension: Secondary | ICD-10-CM | POA: Insufficient documentation

## 2024-01-22 DIAGNOSIS — Z79899 Other long term (current) drug therapy: Secondary | ICD-10-CM | POA: Insufficient documentation

## 2024-01-22 DIAGNOSIS — R030 Elevated blood-pressure reading, without diagnosis of hypertension: Secondary | ICD-10-CM

## 2024-01-22 MED ORDER — ACETAMINOPHEN 500 MG PO TABS
1000.0000 mg | ORAL_TABLET | Freq: Once | ORAL | Status: AC
  Administered 2024-01-22: 1000 mg via ORAL
  Filled 2024-01-22: qty 2

## 2024-01-22 NOTE — ED Provider Notes (Signed)
 [place] [facility] Provider Note   CSN: [id number] Arrival date & time: 01/22/24  2230     History {Add pertinent medical, surgical, social history, OB history to HPI:1} Chief Complaint  Patient presents with   Hypertension    Brenda Hunter is a 89 y.o. female.  The history is provided by the patient.  Hypertension This is a chronic problem. The current episode started more than 1 week ago. The problem occurs constantly. The problem has been gradually worsening. Pertinent negatives include no chest pain, no abdominal pain, no headaches and no shortness of breath. Nothing aggravates the symptoms. Nothing relieves the symptoms. Treatments tried: just started a new medicine at 10 am, has been elevated since she started getting knee injections for R knee pain.  Patient with HTN and knee pain who started having worsening BPs with knee injections presents with ongoing BP elevation.  Patient is asymptomatic.  She just started a new medication for this at 10 am.      Past Medical History:  Diagnosis Date   Arthritis    KNEES   Complication of anesthesia    MALES LOTS OF URINE AFTER ANESTHESIA   Dysrhythmia    GERD (gastroesophageal reflux disease)    Hematuria    Bladder Spasms (Dr. Logan Bores)   Hiatal hernia    History of rectal polyps    HOH (hard of hearing)    LEFT EAR   Nausea & vomiting 05/14/2023   Peripheral edema      Home Medications Prior to Admission medications   Medication Sig Start Date End Date Taking? Authorizing Provider  methenamine (HIPREX) 1 g tablet Take 1 g by mouth 2 (two) times daily. 05/17/17   [provider]  metoprolol succinate (TOPROL-XL) 25 MG 24 hr tablet TAKE ONE (1) TABLET BY MOUTH EACH DAY 01/20/21   Jake Bathe, MD  ondansetron (ZOFRAN) 4 MG tablet Take 1 tablet (4 mg total) by mouth every 8 (eight) hours as needed for nausea. 05/19/23   Karie Soda, MD  oxybutynin (DITROPAN) 5 MG tablet Take 5  mg by mouth daily as needed for bladder spasms.    [provider]  pyridOXINE (VITAMIN B-6) 100 MG tablet Take 100 mg by mouth daily.    [provider]  vitamin B-12 (CYANOCOBALAMIN) 1000 MCG tablet Take 1,000 mcg by mouth daily.    [provider]      Allergies    Macrodantin [nitrofurantoin macrocrystal], Nsaids, Oxycodone, and Percocet [oxycodone-acetaminophen]    Review of Systems   Review of Systems  Constitutional:  Negative for diaphoresis and fever.  Respiratory:  Negative for shortness of breath.   Cardiovascular:  Negative for chest pain.  Gastrointestinal:  Negative for abdominal pain.  Neurological:  Negative for headaches.  All other systems reviewed and are negative.   Physical Exam Updated Vital Signs BP (!) 165/50   Pulse 72   Temp 98.3 F (36.8 C) (Oral)   Resp 16   Ht 5\' 3"  (1.6 m)   Wt 64.6 kg   SpO2 97%   BMI 25.23 kg/m  Physical Exam Vitals and nursing note reviewed.  Constitutional:      General: She is not in acute distress.    Appearance: Normal appearance. She is well-developed.  HENT:     Head: Normocephalic and atraumatic.     Nose: Nose normal.  Eyes:     Pupils: Pupils are equal, round, and reactive to light.  Cardiovascular:     Rate and Rhythm: Normal rate and regular rhythm.     Pulses: Normal pulses.     Heart sounds: Normal heart sounds.  Pulmonary:     Effort: Pulmonary effort is normal. No respiratory distress.     Breath sounds: Normal breath sounds.  Abdominal:     General: Bowel sounds are normal. There is no distension.     Palpations: Abdomen is soft.     Tenderness: There is no abdominal tenderness. There is no guarding or rebound.  Genitourinary:    Vagina: No vaginal discharge.  Musculoskeletal:        General: Normal range of motion.     Cervical back: Neck supple.  Skin:    General: Skin is warm and dry.     Capillary Refill: Capillary refill takes less than 2 seconds.     Findings:  No erythema or rash.  Neurological:     General: No focal deficit present.     Mental Status: She is alert and oriented to person, place, and time.     Deep Tendon Reflexes: Reflexes normal.  Psychiatric:        Mood and Affect: Mood normal.     ED Results / Procedures / Treatments   Labs (all labs ordered are listed, but only abnormal results are displayed) Labs Reviewed - No data to display  EKG None  Radiology No results found.  Procedures Procedures  {Document cardiac monitor, telemetry assessment procedure when appropriate:1}  Medications Ordered in ED Medications  acetaminophen (TYLENOL) tablet 1,000 mg (1,000 mg Oral Given 01/22/24 2316)    ED Course/ Medical Decision Making/ A&P   {   Click here for ABCD2, HEART and other calculatorsREFRESH Note before signing :1}                              Medical Decision Making Patient with asymptomatic BP elevation somethime normal sometimes elevated since having knee issues   Amount and/or Complexity of Data Reviewed Independent Historian: spouse    Details: See above  External Data Reviewed: notes.    Details: Previous notes reviewed   Risk OTC drugs.    Final Clinical Impression(s) / ED Diagnoses Final diagnoses:  Elevated blood pressure reading  Acute pain of right knee    No signs of systemic illness or infection. The patient is nontoxic-appearing on exam and vital signs are within normal limits.  I have reviewed the triage vital signs and the nursing notes. Pertinent labs & imaging results that were available during my care of the patient were reviewed by me and considered in my medical decision making (see chart for details). After history, exam, and medical workup I feel the patient has been appropriately medically screened and is safe for discharge home. Pertinent diagnoses were discussed with the patient. Patient was given return precautions.  Rx / DC Orders ED Discharge Orders     None

## 2024-01-22 NOTE — ED Triage Notes (Signed)
 Pt arrived from home via Pov c/o HTN x 4 days. Pt was seen at PCP office and started on ibersatan 75mg  that she began this morning. Pt states bp at home was 205/85 prior to coming to ED.

## 2024-01-23 DIAGNOSIS — M1711 Unilateral primary osteoarthritis, right knee: Secondary | ICD-10-CM | POA: Diagnosis not present

## 2024-01-24 ENCOUNTER — Other Ambulatory Visit: Payer: Self-pay

## 2024-01-24 ENCOUNTER — Emergency Department (HOSPITAL_BASED_OUTPATIENT_CLINIC_OR_DEPARTMENT_OTHER)

## 2024-01-24 ENCOUNTER — Encounter (HOSPITAL_BASED_OUTPATIENT_CLINIC_OR_DEPARTMENT_OTHER): Payer: Self-pay

## 2024-01-24 ENCOUNTER — Emergency Department (HOSPITAL_BASED_OUTPATIENT_CLINIC_OR_DEPARTMENT_OTHER)
Admission: EM | Admit: 2024-01-24 | Discharge: 2024-01-24 | Disposition: A | Discharge status: Home / Self Care | Attending: Emergency Medicine | Admitting: Emergency Medicine

## 2024-01-24 DIAGNOSIS — E860 Dehydration: Secondary | ICD-10-CM | POA: Diagnosis not present

## 2024-01-24 DIAGNOSIS — J320 Chronic maxillary sinusitis: Secondary | ICD-10-CM | POA: Insufficient documentation

## 2024-01-24 DIAGNOSIS — I10 Essential (primary) hypertension: Secondary | ICD-10-CM | POA: Insufficient documentation

## 2024-01-24 DIAGNOSIS — B379 Candidiasis, unspecified: Secondary | ICD-10-CM | POA: Insufficient documentation

## 2024-01-24 DIAGNOSIS — I6782 Cerebral ischemia: Secondary | ICD-10-CM | POA: Insufficient documentation

## 2024-01-24 DIAGNOSIS — Z79899 Other long term (current) drug therapy: Secondary | ICD-10-CM | POA: Diagnosis not present

## 2024-01-24 DIAGNOSIS — N3281 Overactive bladder: Secondary | ICD-10-CM | POA: Diagnosis not present

## 2024-01-24 DIAGNOSIS — I700 Atherosclerosis of aorta: Secondary | ICD-10-CM | POA: Diagnosis not present

## 2024-01-24 DIAGNOSIS — R531 Weakness: Secondary | ICD-10-CM | POA: Diagnosis not present

## 2024-01-24 DIAGNOSIS — R110 Nausea: Secondary | ICD-10-CM

## 2024-01-24 DIAGNOSIS — R42 Dizziness and giddiness: Secondary | ICD-10-CM | POA: Diagnosis not present

## 2024-01-24 DIAGNOSIS — R519 Headache, unspecified: Secondary | ICD-10-CM | POA: Diagnosis not present

## 2024-01-24 DIAGNOSIS — R109 Unspecified abdominal pain: Secondary | ICD-10-CM | POA: Diagnosis not present

## 2024-01-24 DIAGNOSIS — N281 Cyst of kidney, acquired: Secondary | ICD-10-CM | POA: Diagnosis not present

## 2024-01-24 DIAGNOSIS — K5730 Diverticulosis of large intestine without perforation or abscess without bleeding: Secondary | ICD-10-CM | POA: Diagnosis not present

## 2024-01-24 DIAGNOSIS — I959 Hypotension, unspecified: Secondary | ICD-10-CM | POA: Diagnosis not present

## 2024-01-24 DIAGNOSIS — R1013 Epigastric pain: Secondary | ICD-10-CM | POA: Diagnosis not present

## 2024-01-24 LAB — RESP PANEL BY RT-PCR (RSV, FLU A&B, COVID)  RVPGX2
Influenza A by PCR: NEGATIVE
Influenza B by PCR: NEGATIVE
Resp Syncytial Virus by PCR: NEGATIVE
SARS Coronavirus 2 by RT PCR: NEGATIVE

## 2024-01-24 LAB — URINALYSIS, MICROSCOPIC (REFLEX)

## 2024-01-24 LAB — CBC WITH DIFFERENTIAL/PLATELET
Abs Immature Granulocytes: 0.02 10*3/uL (ref 0.00–0.07)
Basophils Absolute: 0.1 10*3/uL (ref 0.0–0.1)
Basophils Relative: 1 %
Eosinophils Absolute: 0.2 10*3/uL (ref 0.0–0.5)
Eosinophils Relative: 2 %
HCT: 44.2 % (ref 36.0–46.0)
Hemoglobin: 14.4 g/dL (ref 12.0–15.0)
Immature Granulocytes: 0 %
Lymphocytes Relative: 37 %
Lymphs Abs: 2.4 10*3/uL (ref 0.7–4.0)
MCH: 29.3 pg (ref 26.0–34.0)
MCHC: 32.6 g/dL (ref 30.0–36.0)
MCV: 90 fL (ref 80.0–100.0)
Monocytes Absolute: 0.5 10*3/uL (ref 0.1–1.0)
Monocytes Relative: 7 %
Neutro Abs: 3.4 10*3/uL (ref 1.7–7.7)
Neutrophils Relative %: 53 %
Platelets: 255 10*3/uL (ref 150–400)
RBC: 4.91 MIL/uL (ref 3.87–5.11)
RDW: 13.6 % (ref 11.5–15.5)
WBC: 6.5 10*3/uL (ref 4.0–10.5)
nRBC: 0 % (ref 0.0–0.2)

## 2024-01-24 LAB — COMPREHENSIVE METABOLIC PANEL WITH GFR
ALT: 15 U/L (ref 0–44)
AST: 22 U/L (ref 15–41)
Albumin: 3.9 g/dL (ref 3.5–5.0)
Alkaline Phosphatase: 59 U/L (ref 38–126)
Anion gap: 10 (ref 5–15)
BUN: 24 mg/dL — ABNORMAL HIGH (ref 8–23)
CO2: 23 mmol/L (ref 22–32)
Calcium: 9.5 mg/dL (ref 8.9–10.3)
Chloride: 105 mmol/L (ref 98–111)
Creatinine, Ser: 0.71 mg/dL (ref 0.44–1.00)
GFR, Estimated: >60 mL/min (ref >60)
Glucose, Bld: 134 mg/dL — ABNORMAL HIGH (ref 70–99)
Potassium: 4 mmol/L (ref 3.5–5.1)
Sodium: 138 mmol/L (ref 135–145)
Total Bilirubin: 0.8 mg/dL (ref 0.0–1.2)
Total Protein: 7.1 g/dL (ref 6.5–8.1)

## 2024-01-24 LAB — URINALYSIS, ROUTINE W REFLEX MICROSCOPIC
Bilirubin Urine: NEGATIVE
Glucose, UA: NEGATIVE mg/dL
Ketones, ur: NEGATIVE mg/dL
Leukocytes,Ua: NEGATIVE
Nitrite: NEGATIVE
Protein, ur: NEGATIVE mg/dL
Specific Gravity, Urine: 1.02 (ref 1.005–1.030)
pH: 7 (ref 5.0–8.0)

## 2024-01-24 LAB — TROPONIN I (HIGH SENSITIVITY)
Troponin I (High Sensitivity): 3 ng/L (ref <18)
Troponin I (High Sensitivity): 4 ng/L (ref <18)

## 2024-01-24 LAB — LIPASE, BLOOD: Lipase: 28 U/L (ref 11–51)

## 2024-01-24 MED ORDER — IOHEXOL 300 MG/ML  SOLN
100.0000 mL | Freq: Once | INTRAMUSCULAR | Status: AC | PRN
  Administered 2024-01-24: 100 mL via INTRAVENOUS

## 2024-01-24 MED ORDER — PROMETHAZINE HCL 25 MG/ML IJ SOLN
INTRAMUSCULAR | Status: AC
  Filled 2024-01-24: qty 1

## 2024-01-24 MED ORDER — SODIUM CHLORIDE 0.9 % IV BOLUS
500.0000 mL | Freq: Once | INTRAVENOUS | Status: AC
  Administered 2024-01-24: 500 mL via INTRAVENOUS

## 2024-01-24 MED ORDER — FLUCONAZOLE 150 MG PO TABS
150.0000 mg | ORAL_TABLET | Freq: Once | ORAL | Status: AC
Start: 2024-01-24 — End: 2024-01-24
  Administered 2024-01-24: 150 mg via ORAL
  Filled 2024-01-24: qty 1

## 2024-01-24 MED ORDER — ONDANSETRON HCL 4 MG/2ML IJ SOLN
4.0000 mg | Freq: Once | INTRAMUSCULAR | Status: AC
  Administered 2024-01-24: 4 mg via INTRAVENOUS
  Filled 2024-01-24: qty 2

## 2024-01-24 MED ORDER — ONDANSETRON 4 MG PO TBDP: 4.0000 mg | ORAL_TABLET | Freq: Three times a day (TID) | ORAL | 0 refills | Status: AC | PRN

## 2024-01-24 MED ORDER — ACETAMINOPHEN 325 MG PO TABS
650.0000 mg | ORAL_TABLET | Freq: Once | ORAL | Status: AC
  Administered 2024-01-24: 650 mg via ORAL
  Filled 2024-01-24: qty 2

## 2024-01-24 MED ORDER — SODIUM CHLORIDE 0.9 % IV SOLN
12.5000 mg | Freq: Once | INTRAVENOUS | Status: AC
  Administered 2024-01-24: 12.5 mg via INTRAVENOUS
  Filled 2024-01-24: qty 0.5

## 2024-01-24 NOTE — ED Notes (Signed)
 Fall risk armband Fall risk socks Fall risk sign on door

## 2024-01-24 NOTE — ED Triage Notes (Signed)
 Reports nausea, epigastric pain since last Wednesday. Denies diarrhea, emesis or constipation  Hx of hiatal hernia surgery in July 2024

## 2024-01-24 NOTE — Discharge Instructions (Addendum)
 Pick a time ONCE a day to check your blood pressure a few hours after taking your medications.

## 2024-01-24 NOTE — ED Notes (Signed)
 Pt one person assist to [facility].  C/o mild dizziness upon standing, resolves quickly.

## 2024-01-24 NOTE — ED Notes (Signed)
D/c paperwork reviewed with pt, including prescriptions and follow up care.  No questions or concerns voiced at time of d/c. Brenda Hunter Pt verbalized understanding, Wheeled by ED staff to ED exit, NAD.

## 2024-01-24 NOTE — ED Provider Notes (Signed)
 [place] [facility] Provider Note   CSN: [id number] Arrival date & time: 01/24/24  6213     History  Chief Complaint  Patient presents with   Nausea    Brenda Hunter is a 89 y.o. female.  Pt is an 89 yo female with pmhx significant for HTN, GERD, HOH, and overactive bladder.  Pt presents to the ED today with nausea.  Pt has been having problems with her bp going u.  She saw her doctor on 3/27 and was restarted on irbasartan.  She came to the ED on 3/30 for htn and eval was neg.  She has been checking her blood pressure frequently and is now nauseous.  She was also having some dry heaving and is now worried there is something wrong with her hiatal hernia repair.  She denies any pain.       Home Medications Prior to Admission medications   Medication Sig Start Date End Date Taking? Authorizing Provider  ondansetron (ZOFRAN-ODT) 4 MG disintegrating tablet Take 1 tablet (4 mg total) by mouth every 8 (eight) hours as needed. 01/24/24  Yes Jacalyn Lefevre, MD  methenamine (HIPREX) 1 g tablet Take 1 g by mouth 2 (two) times daily. 05/17/17   [provider]  metoprolol succinate (TOPROL-XL) 25 MG 24 hr tablet TAKE ONE (1) TABLET BY MOUTH EACH DAY 01/20/21   Jake Bathe, MD  ondansetron (ZOFRAN) 4 MG tablet Take 1 tablet (4 mg total) by mouth every 8 (eight) hours as needed for nausea. 05/19/23   Karie Soda, MD  oxybutynin (DITROPAN) 5 MG tablet Take 5 mg by mouth daily as needed for bladder spasms.    [provider]  pyridOXINE (VITAMIN B-6) 100 MG tablet Take 100 mg by mouth daily.    [provider]  vitamin B-12 (CYANOCOBALAMIN) 1000 MCG tablet Take 1,000 mcg by mouth daily.    [provider]      Allergies    Macrodantin [nitrofurantoin macrocrystal], Nsaids, Oxycodone, and Percocet [oxycodone-acetaminophen]    Review of Systems   Review of Systems  Gastrointestinal:  Positive for nausea.  All other  systems reviewed and are negative.   Physical Exam Updated Vital Signs BP (!) 142/49   Pulse 68   Temp 99 F (37.2 C)   Resp 19   Ht 5\' 3"  (1.6 m)   Wt 64.4 kg   SpO2 96%   BMI 25.15 kg/m  Physical Exam Vitals and nursing note reviewed.  Constitutional:      Appearance: Normal appearance.  HENT:     Head: Normocephalic and atraumatic.     Comments: Birthmark under left eye    Right Ear: External ear normal.     Left Ear: External ear normal.     Nose: Nose normal.     Mouth/Throat:     Mouth: Mucous membranes are dry.  Eyes:     Extraocular Movements: Extraocular movements intact.     Conjunctiva/sclera: Conjunctivae normal.     Pupils: Pupils are equal, round, and reactive to light.  Cardiovascular:     Rate and Rhythm: Normal rate and regular rhythm.     Pulses: Normal pulses.     Heart sounds: Normal heart sounds.  Pulmonary:     Effort: Pulmonary effort is normal.     Breath sounds: Normal breath sounds.  Abdominal:     General: Abdomen is flat. Bowel sounds are normal.     Palpations: Abdomen is soft.  Musculoskeletal:        General: Normal range of motion.     Cervical back: Normal range of motion and neck supple.  Skin:    General: Skin is warm.     Capillary Refill: Capillary refill takes less than 2 seconds.  Neurological:     General: No focal deficit present.     Mental Status: She is alert and oriented to person, place, and time.  Psychiatric:        Mood and Affect: Mood normal.        Behavior: Behavior normal.     ED Results / Procedures / Treatments   Labs (all labs ordered are listed, but only abnormal results are displayed) Labs Reviewed  COMPREHENSIVE METABOLIC PANEL WITH GFR - Abnormal; Notable for the following components:      Result Value   Glucose, Bld 134 (*)    BUN 24 (*)    All other components within normal limits  URINALYSIS, ROUTINE W REFLEX MICROSCOPIC - Abnormal; Notable for the following components:   APPearance HAZY  (*)    Hgb urine dipstick TRACE (*)    All other components within normal limits  URINALYSIS, MICROSCOPIC (REFLEX) - Abnormal; Notable for the following components:   Bacteria, UA RARE (*)    All other components within normal limits  RESP PANEL BY RT-PCR (RSV, FLU A&B, COVID)  RVPGX2  CBC WITH DIFFERENTIAL/PLATELET  LIPASE, BLOOD  TROPONIN I (HIGH SENSITIVITY)  TROPONIN I (HIGH SENSITIVITY)    EKG EKG Interpretation Date/Time:  Tuesday January 24 2024 09:12:33 EDT Ventricular Rate:  72 PR Interval:  212 QRS Duration:  124 QT Interval:  394 QTC Calculation: 432 R Axis:   -65  Text Interpretation: Sinus rhythm Borderline prolonged PR interval Nonspecific IVCD with LAD Left ventricular hypertrophy Anterior Q waves, possibly due to LVH Since last tracing rate slower Confirmed by Jacalyn Lefevre [phone]) on 01/24/2024 9:17:15 AM  Radiology CT ABDOMEN PELVIS W CONTRAST Result Date: 01/24/2024 CLINICAL DATA:  Abdominal pain, acute, nonlocalized. Nausea, epigastric pain since last Wednesday. EXAM: CT ABDOMEN AND PELVIS WITH CONTRAST TECHNIQUE: Multidetector CT imaging of the abdomen and pelvis was performed using the standard protocol following bolus administration of intravenous contrast. RADIATION DOSE REDUCTION: This exam was performed according to the departmental dose-optimization program which includes automated exposure control, adjustment of the mA and/or kV according to patient size and/or use of iterative reconstruction technique. CONTRAST:  OMNIPAQUE IOHEXOL 300 MG/ML  SOLN COMPARISON:  CT scan abdomen and pelvis from 05/14/2023. FINDINGS: Lower chest: There are dependent atelectatic changes in the visualized lung bases. No overt consolidation. No pleural effusion. The heart is normal in size. No pericardial effusion. Hepatobiliary: The liver is normal in size. Non-cirrhotic configuration. No suspicious mass. Redemonstration of hyperattenuating 1.0 x 1.9 cm lesion in the caudate lobe,  similar to the prior study and favored to represent a flash filling hemangioma. These is mild heterogeneous hepatic steatosis. No intrahepatic or extrahepatic bile duct dilation. No calcified gallstones. Normal gallbladder wall thickness. No pericholecystic inflammatory changes. Pancreas: Unremarkable. No pancreatic ductal dilatation or surrounding inflammatory changes. Spleen: Within normal limits. No focal lesion. Adrenals/Urinary Tract: Adrenal glands are unremarkable. No suspicious renal mass. There is a minimally exophytic 1.5 x 2.3 cm cyst arising from the right kidney upper pole, laterally. There are multiple sinus cysts throughout bilateral kidneys. No nephroureterolithiasis or obstructive uropathy. Unremarkable urinary bladder. Stomach/Bowel: There are postsurgical changes in the GE junction region from prior hernia repair. No  disproportionate dilation of the small or large bowel loops. No evidence of abnormal bowel wall thickening or inflammatory changes. The appendix is unremarkable. There are multiple diverticula mainly in the sigmoid colon, without imaging signs of diverticulitis. Vascular/Lymphatic: No ascites or pneumoperitoneum. No abdominal or pelvic lymphadenopathy, by size criteria. No aneurysmal dilation of the major abdominal arteries. There are moderate peripheral atherosclerotic vascular calcifications of the aorta and its major branches. Multiple tiny venous collaterals noted in the left para-aortic region, similar to the prior study. Reproductive: The uterus is surgically absent. No large adnexal mass. Other: The visualized soft tissues and abdominal wall are unremarkable. Musculoskeletal: No suspicious osseous lesions. There are moderate multilevel degenerative changes in the visualized spine. IMPRESSION: 1. No acute inflammatory process identified within the abdomen or pelvis. 2. Multiple other nonacute observations, as described above. Aortic Atherosclerosis (ICD10-I70.0). Electronically  Signed   By: Jules Schick M.D.   On: 01/24/2024 13:33   CT Head Wo Contrast Result Date: 01/24/2024 CLINICAL DATA:  Provided history: Headache, new onset. Dizziness. EXAM: CT HEAD WITHOUT CONTRAST TECHNIQUE: Contiguous axial images were obtained from the base of the skull through the vertex without intravenous contrast. RADIATION DOSE REDUCTION: This exam was performed according to the departmental dose-optimization program which includes automated exposure control, adjustment of the mA and/or kV according to patient size and/or use of iterative reconstruction technique. COMPARISON:  None. FINDINGS: Brain: Generalized cerebral and cerebellar atrophy. Patchy and ill-defined hypoattenuation within the cerebral white matter, nonspecific but compatible with moderate-to-advanced chronic small vessel ischemic disease. There is no acute intracranial hemorrhage. No demarcated cortical infarct. No extra-axial fluid collection. No evidence of an intracranial mass. No midline shift. Vascular: No hyperdense vessel. Atherosclerotic calcifications. Skull: No calvarial fracture or aggressive osseous lesion. Sinuses/Orbits: Mild mucosal thickening versus small mucous retention cyst within the right maxillary sinus at the imaged levels. Other: Small left mastoid effusion. IMPRESSION: 1. No evidence of an acute intracranial abnormality. 2. Moderate-to-advanced chronic small vessel ischemic changes within the cerebral white matter. 3. Generalized parenchymal atrophy. 4. Mild right maxillary sinus disease at the imaged levels, as described. 5. Small left mastoid effusion. Electronically Signed   By: Jackey Loge D.O.   On: 01/24/2024 12:22   DG Chest Portable 1 View Result Date: 01/24/2024 CLINICAL DATA:  Hypertension. EXAM: PORTABLE CHEST 1 VIEW COMPARISON:  03/15/2017. FINDINGS: Bilateral lung fields are clear. Bilateral costophrenic angles are clear. Normal cardio-mediastinal silhouette. No acute osseous abnormalities. The  soft tissues are within normal limits. IMPRESSION: No active disease. Electronically Signed   By: Jules Schick M.D.   On: 01/24/2024 10:54    Procedures Procedures    Medications Ordered in ED Medications  promethazine (PHENERGAN) 25 MG/ML injection (  Not Given 01/24/24 1019)  fluconazole (DIFLUCAN) tablet 150 mg (has no administration in time range)  sodium chloride 0.9 % bolus 500 mL (0 mLs Intravenous Stopped 01/24/24 1019)  ondansetron (ZOFRAN) injection 4 mg (4 mg Intravenous Given 01/24/24 0905)  promethazine (PHENERGAN) 12.5 mg in sodium chloride 0.9 % 50 mL IVPB (0 mg Intravenous Stopped 01/24/24 1051)  acetaminophen (TYLENOL) tablet 650 mg (650 mg Oral Given 01/24/24 1014)  sodium chloride 0.9 % bolus 500 mL (0 mLs Intravenous Stopped 01/24/24 1237)  iohexol (OMNIPAQUE) 300 MG/ML solution 100 mL (100 mLs Intravenous Contrast Given 01/24/24 1141)    ED Course/ Medical Decision Making/ A&P  Medical Decision Making Amount and/or Complexity of Data Reviewed Labs: ordered. Radiology: ordered.  Risk OTC drugs. Prescription drug management.   This patient presents to the ED for concern of nausea, this involves an extensive number of treatment options, and is a complaint that carries with it a high risk of complications and morbidity.  The differential diagnosis includes cardiac, pulm, gi, infection   Co morbidities that complicate the patient evaluation  HTN, GERD, HOH, and overactive bladder   Additional history obtained:  Additional history obtained from epic chart review External records from outside source obtained and reviewed including EMS report   Lab Tests:  I Ordered, and personally interpreted labs.  The pertinent results include:  cbc nl, lip nl, cmp nl, trop nl, ua neg for uti, but + yeast; covid/flu/rsv neg   Imaging Studies ordered:  I ordered imaging studies including cxr, ct head, ct abd/pelvis  I independently visualized  and interpreted imaging which showed  CXR: No active disease.  CT head: No evidence of an acute intracranial abnormality.  2. Moderate-to-advanced chronic small vessel ischemic changes within  the cerebral white matter.  3. Generalized parenchymal atrophy.  4. Mild right maxillary sinus disease at the imaged levels, as  described.  5. Small left mastoid effusion.  CT abd/pelvis: No acute inflammatory process identified within the abdomen or  pelvis.  2. Multiple other nonacute observations, as described above.    Aortic Atherosclerosis (ICD10-I70.0).   I agree with the radiologist interpretation   Cardiac Monitoring:  The patient was maintained on a cardiac monitor.  I personally viewed and interpreted the cardiac monitored which showed an underlying rhythm of: nsr   Medicines ordered and prescription drug management:  I ordered medication including ivfs/meds  for sx  Reevaluation of the patient after these medicines showed that the patient improved I have reviewed the patients home medicines and have made adjustments as needed   Test Considered:  ct   Critical Interventions:  ivfs  Problem List / ED Course:  Nausea:  pt is feeling better after fluids/meds.  Ct head/abd/pelvis neg.  Pt is tolerating fluids and crackers.  She is stable for d/c.  Return if worse.  HTN:  bp has been fine here.  Pt encouraged to take bp once a day. + yeast in urine:  likely vaginal.  Pt given a dose of diflucan here.   Reevaluation:  After the interventions noted above, I reevaluated the patient and found that they have :improved   Social Determinants of Health:  Lives at home   Dispostion:  After consideration of the diagnostic results and the patients response to treatment, I feel that the patent would benefit from discharge with outpatient f/u.          Final Clinical Impression(s) / ED Diagnoses Final diagnoses:  Dehydration  Nausea  Candidiasis    Rx / DC  Orders ED Discharge Orders          Ordered    ondansetron (ZOFRAN-ODT) 4 MG disintegrating tablet  Every 8 hours PRN        01/24/24 1435              Jacalyn Lefevre, MD 01/24/24 1436

## 2024-01-24 NOTE — ED Notes (Signed)
Pt transporting to imaging.  

## 2024-01-24 NOTE — ED Notes (Signed)
 Triage documentation started by RN, error charting under NT. NT charting removed from narrator. RN triage documented as chart follows

## 2024-01-27 DIAGNOSIS — Z09 Encounter for follow-up examination after completed treatment for conditions other than malignant neoplasm: Secondary | ICD-10-CM | POA: Diagnosis not present

## 2024-01-27 DIAGNOSIS — I10 Essential (primary) hypertension: Secondary | ICD-10-CM | POA: Diagnosis not present

## 2024-01-27 DIAGNOSIS — R519 Headache, unspecified: Secondary | ICD-10-CM | POA: Diagnosis not present

## 2024-01-27 DIAGNOSIS — R112 Nausea with vomiting, unspecified: Secondary | ICD-10-CM | POA: Diagnosis not present

## 2024-01-27 DIAGNOSIS — Z6828 Body mass index (BMI) 28.0-28.9, adult: Secondary | ICD-10-CM | POA: Diagnosis not present

## 2024-02-21 DIAGNOSIS — G8929 Other chronic pain: Secondary | ICD-10-CM | POA: Diagnosis not present

## 2024-02-21 DIAGNOSIS — M1711 Unilateral primary osteoarthritis, right knee: Secondary | ICD-10-CM | POA: Diagnosis not present

## 2024-03-06 DIAGNOSIS — Z87440 Personal history of urinary (tract) infections: Secondary | ICD-10-CM | POA: Diagnosis not present

## 2024-03-06 DIAGNOSIS — R3129 Other microscopic hematuria: Secondary | ICD-10-CM | POA: Diagnosis not present

## 2024-03-16 DIAGNOSIS — H40013 Open angle with borderline findings, low risk, bilateral: Secondary | ICD-10-CM | POA: Diagnosis not present

## 2024-03-16 DIAGNOSIS — N390 Urinary tract infection, site not specified: Secondary | ICD-10-CM | POA: Diagnosis not present

## 2024-03-16 DIAGNOSIS — Z6829 Body mass index (BMI) 29.0-29.9, adult: Secondary | ICD-10-CM | POA: Diagnosis not present

## 2024-04-03 DIAGNOSIS — R350 Frequency of micturition: Secondary | ICD-10-CM | POA: Diagnosis not present

## 2024-04-03 DIAGNOSIS — E7800 Pure hypercholesterolemia, unspecified: Secondary | ICD-10-CM | POA: Diagnosis not present

## 2024-04-03 DIAGNOSIS — Z0000 Encounter for general adult medical examination without abnormal findings: Secondary | ICD-10-CM | POA: Diagnosis not present

## 2024-04-03 DIAGNOSIS — I10 Essential (primary) hypertension: Secondary | ICD-10-CM | POA: Diagnosis not present

## 2024-04-03 DIAGNOSIS — D509 Iron deficiency anemia, unspecified: Secondary | ICD-10-CM | POA: Diagnosis not present

## 2024-04-03 DIAGNOSIS — R300 Dysuria: Secondary | ICD-10-CM | POA: Diagnosis not present

## 2024-04-03 DIAGNOSIS — Z6828 Body mass index (BMI) 28.0-28.9, adult: Secondary | ICD-10-CM | POA: Diagnosis not present

## 2024-04-03 DIAGNOSIS — R7303 Prediabetes: Secondary | ICD-10-CM | POA: Diagnosis not present

## 2024-04-11 ENCOUNTER — Emergency Department (HOSPITAL_BASED_OUTPATIENT_CLINIC_OR_DEPARTMENT_OTHER)

## 2024-04-11 ENCOUNTER — Other Ambulatory Visit: Payer: Self-pay

## 2024-04-11 ENCOUNTER — Emergency Department (HOSPITAL_BASED_OUTPATIENT_CLINIC_OR_DEPARTMENT_OTHER)
Admission: EM | Admit: 2024-04-11 | Discharge: 2024-04-11 | Disposition: A | Discharge status: Home / Self Care | Attending: Emergency Medicine | Admitting: Emergency Medicine

## 2024-04-11 ENCOUNTER — Encounter (HOSPITAL_BASED_OUTPATIENT_CLINIC_OR_DEPARTMENT_OTHER): Payer: Self-pay | Admitting: Emergency Medicine

## 2024-04-11 DIAGNOSIS — N281 Cyst of kidney, acquired: Secondary | ICD-10-CM | POA: Diagnosis not present

## 2024-04-11 DIAGNOSIS — R002 Palpitations: Secondary | ICD-10-CM | POA: Insufficient documentation

## 2024-04-11 DIAGNOSIS — K5730 Diverticulosis of large intestine without perforation or abscess without bleeding: Secondary | ICD-10-CM | POA: Diagnosis not present

## 2024-04-11 DIAGNOSIS — R109 Unspecified abdominal pain: Secondary | ICD-10-CM | POA: Diagnosis not present

## 2024-04-11 DIAGNOSIS — N3000 Acute cystitis without hematuria: Secondary | ICD-10-CM | POA: Diagnosis not present

## 2024-04-11 DIAGNOSIS — R000 Tachycardia, unspecified: Secondary | ICD-10-CM | POA: Insufficient documentation

## 2024-04-11 DIAGNOSIS — I10 Essential (primary) hypertension: Secondary | ICD-10-CM | POA: Diagnosis not present

## 2024-04-11 DIAGNOSIS — M7989 Other specified soft tissue disorders: Secondary | ICD-10-CM | POA: Diagnosis not present

## 2024-04-11 DIAGNOSIS — I119 Hypertensive heart disease without heart failure: Secondary | ICD-10-CM | POA: Diagnosis not present

## 2024-04-11 DIAGNOSIS — R918 Other nonspecific abnormal finding of lung field: Secondary | ICD-10-CM | POA: Insufficient documentation

## 2024-04-11 DIAGNOSIS — N938 Other specified abnormal uterine and vaginal bleeding: Secondary | ICD-10-CM | POA: Diagnosis not present

## 2024-04-11 DIAGNOSIS — I700 Atherosclerosis of aorta: Secondary | ICD-10-CM | POA: Insufficient documentation

## 2024-04-11 DIAGNOSIS — R102 Pelvic and perineal pain: Secondary | ICD-10-CM

## 2024-04-11 DIAGNOSIS — R079 Chest pain, unspecified: Secondary | ICD-10-CM | POA: Diagnosis not present

## 2024-04-11 DIAGNOSIS — R1030 Lower abdominal pain, unspecified: Secondary | ICD-10-CM | POA: Diagnosis present

## 2024-04-11 LAB — CBC WITH DIFFERENTIAL/PLATELET
Abs Immature Granulocytes: 0.04 10*3/uL (ref 0.00–0.07)
Basophils Absolute: 0.1 10*3/uL (ref 0.0–0.1)
Basophils Relative: 1 %
Eosinophils Absolute: 0.2 10*3/uL (ref 0.0–0.5)
Eosinophils Relative: 3 %
HCT: 44.9 % (ref 36.0–46.0)
Hemoglobin: 14.3 g/dL (ref 12.0–15.0)
Immature Granulocytes: 1 %
Lymphocytes Relative: 34 %
Lymphs Abs: 2.5 10*3/uL (ref 0.7–4.0)
MCH: 29.2 pg (ref 26.0–34.0)
MCHC: 31.8 g/dL (ref 30.0–36.0)
MCV: 91.8 fL (ref 80.0–100.0)
Monocytes Absolute: 0.6 10*3/uL (ref 0.1–1.0)
Monocytes Relative: 8 %
Neutro Abs: 4 10*3/uL (ref 1.7–7.7)
Neutrophils Relative %: 53 %
Platelets: 261 10*3/uL (ref 150–400)
RBC: 4.89 MIL/uL (ref 3.87–5.11)
RDW: 13.4 % (ref 11.5–15.5)
WBC: 7.4 10*3/uL (ref 4.0–10.5)
nRBC: 0 % (ref 0.0–0.2)

## 2024-04-11 LAB — COMPREHENSIVE METABOLIC PANEL WITH GFR
ALT: 15 U/L (ref 0–44)
AST: 24 U/L (ref 15–41)
Albumin: 4.5 g/dL (ref 3.5–5.0)
Alkaline Phosphatase: 72 U/L (ref 38–126)
Anion gap: 13 (ref 5–15)
BUN: 17 mg/dL (ref 8–23)
CO2: 25 mmol/L (ref 22–32)
Calcium: 10 mg/dL (ref 8.9–10.3)
Chloride: 105 mmol/L (ref 98–111)
Creatinine, Ser: 0.67 mg/dL (ref 0.44–1.00)
GFR, Estimated: >60 mL/min (ref >60)
Glucose, Bld: 129 mg/dL — ABNORMAL HIGH (ref 70–99)
Potassium: 4 mmol/L (ref 3.5–5.1)
Sodium: 143 mmol/L (ref 135–145)
Total Bilirubin: 0.3 mg/dL (ref 0.0–1.2)
Total Protein: 7.4 g/dL (ref 6.5–8.1)

## 2024-04-11 LAB — D-DIMER, QUANTITATIVE: D-Dimer, Quant: 0.55 ug{FEU}/mL — ABNORMAL HIGH (ref 0.00–0.50)

## 2024-04-11 LAB — URINALYSIS, ROUTINE W REFLEX MICROSCOPIC
Bilirubin Urine: NEGATIVE
Glucose, UA: NEGATIVE mg/dL
Ketones, ur: NEGATIVE mg/dL
Leukocytes,Ua: NEGATIVE
Nitrite: POSITIVE — AB
Protein, ur: NEGATIVE mg/dL
Specific Gravity, Urine: 1.01 (ref 1.005–1.030)
pH: 5 (ref 5.0–8.0)

## 2024-04-11 LAB — URINALYSIS, MICROSCOPIC (REFLEX)

## 2024-04-11 LAB — PRO BRAIN NATRIURETIC PEPTIDE: Pro Brain Natriuretic Peptide: 383 pg/mL — ABNORMAL HIGH (ref <300.0)

## 2024-04-11 LAB — MAGNESIUM: Magnesium: 2.2 mg/dL (ref 1.7–2.4)

## 2024-04-11 LAB — TROPONIN T, HIGH SENSITIVITY
Troponin T High Sensitivity: <15 ng/L (ref <19)
Troponin T High Sensitivity: <15 ng/L (ref <19)

## 2024-04-11 MED ORDER — IOHEXOL 350 MG/ML SOLN
100.0000 mL | Freq: Once | INTRAVENOUS | Status: AC | PRN
  Administered 2024-04-11: 100 mL via INTRAVENOUS

## 2024-04-11 MED ORDER — CEPHALEXIN 500 MG PO CAPS: 500.0000 mg | ORAL_CAPSULE | Freq: Two times a day (BID) | ORAL | 0 refills | Status: AC

## 2024-04-11 MED ORDER — SODIUM CHLORIDE 0.9 % IV SOLN
1.0000 g | Freq: Once | INTRAVENOUS | Status: AC
  Administered 2024-04-11: 1 g via INTRAVENOUS
  Filled 2024-04-11: qty 10

## 2024-04-11 NOTE — ED Notes (Signed)
 Patient transported to X-ray

## 2024-04-11 NOTE — Discharge Instructions (Signed)
 You have been seen in the Emergency Department (ED) today for pain when urinating.  Your workup today suggests that you have a urinary tract infection (UTI).  Please take your antibiotic as prescribed and over-the-counter pain medication (Tylenol) as needed, but no more than recommended on the label instructions.  Drink PLENTY of fluids.  Call your regular doctor to schedule the next available appointment to follow up on today?s ED visit, or return immediately to the ED if your pain worsens, you have decreased urine production, develop fever, persistent vomiting, or other symptoms that concern you.

## 2024-04-11 NOTE — ED Triage Notes (Signed)
 Urinary frequency and bladder pain, for about 1 week. Seen at PCP, given meds. Woke up tonight with high hear rate and leg swelling.

## 2024-04-11 NOTE — ED Provider Notes (Signed)
 Blood pressure (!) 185/81, pulse 85, temperature 98.5 F (36.9 C), temperature source Oral, resp. rate 20, height 5' 2 (1.575 m), weight 68 kg, SpO2 96%.  Assuming care from Dr. Luberta Ruse.  In short, Brenda Hunter is a 89 y.o. female with a chief complaint of Pelvic Pain .  Refer to the original H&P for additional details.  The current plan of care is to follow up on labs.  10:26 AM  CT angio doubt PE or other acute process in the lungs.  CT abdomen pelvis is similarly reassuring.  UA with evidence of infection.  Doubt pyelonephritis clinically or radiographically.  No leukocytosis.  Plan for Rocephin  here and discharged home with Keflex.  Urine culture sent. Discussed strict ED return precautions.    Roberts Ching, MD 04/11/24 1026

## 2024-04-11 NOTE — ED Notes (Signed)
 Patient transported to CT

## 2024-04-11 NOTE — ED Provider Notes (Signed)
 [place] [facility] Provider Note   CSN: [id number] Arrival date & time: 04/11/24  0607     Patient presents with: Pelvic Pain   Brenda Hunter is a 89 y.o. female.  {Add pertinent medical, surgical, social history, OB history to HPI:32947} 89 yo F here with suprapubic pain for the last week with increased urinary frequency. Had a UA at PCP with calcium or protein or both? Tonight woke up with swelling in legs along with palpitiations and HTN. Came here for eval. Denies h/o renal stones. Has used pyridium recently with some relief but frustrated that she hasn't had any answers recently. States she had a full body ct recently that was normal.    Pelvic Pain       Prior to Admission medications   Medication Sig Start Date End Date Taking? Authorizing Provider  methenamine (HIPREX) 1 g tablet Take 1 g by mouth 2 (two) times daily. 05/17/17   [provider]  metoprolol  succinate (TOPROL -XL) 25 MG 24 hr tablet TAKE ONE (1) TABLET BY MOUTH EACH DAY 01/20/21   Hugh Madura, MD  ondansetron  (ZOFRAN ) 4 MG tablet Take 1 tablet (4 mg total) by mouth every 8 (eight) hours as needed for nausea. 05/19/23   Candyce Champagne, MD  ondansetron  (ZOFRAN -ODT) 4 MG disintegrating tablet Take 1 tablet (4 mg total) by mouth every 8 (eight) hours as needed. 01/24/24   Haviland, Julie, MD  oxybutynin (DITROPAN) 5 MG tablet Take 5 mg by mouth daily as needed for bladder spasms.    [provider]  pyridOXINE (VITAMIN B-6) 100 MG tablet Take 100 mg by mouth daily.    [provider]  vitamin B-12 (CYANOCOBALAMIN) 1000 MCG tablet Take 1,000 mcg by mouth daily.    [provider]    Allergies: Macrodantin [nitrofurantoin macrocrystal], Nsaids, Oxycodone, and Percocet [oxycodone-acetaminophen ]    Review of Systems  Genitourinary:  Positive for pelvic pain.    Updated Vital Signs Ht 5' 2 (1.575 m)   Wt 68 kg   BMI 27.44 kg/m    Physical Exam Vitals and nursing note reviewed.  Constitutional:      Appearance: She is well-developed.  HENT:     Head: Normocephalic and atraumatic.   Cardiovascular:     Rate and Rhythm: Regular rhythm. Tachycardia present.  Pulmonary:     Effort: No respiratory distress.     Breath sounds: No stridor.     Comments: O2 sats of 95% Abdominal:     General: There is no distension.   Musculoskeletal:     Cervical back: Normal range of motion.   Skin:    General: Skin is warm and dry.   Neurological:     Mental Status: She is alert. Mental status is at baseline.     Comments: Baseline facial asymmetry    (all labs ordered are listed, but only abnormal results are displayed) Labs Reviewed - No data to display  EKG: None  Radiology: No results found.  {Document cardiac monitor, telemetry assessment procedure when appropriate:32947} Procedures   Medications Ordered in the ED - No data to display    {Click here for ABCD2, HEART and other calculators REFRESH Note before signing:1}                              Medical Decision Making Amount and/or Complexity of Data Reviewed Labs: ordered. Radiology: ordered. ECG/medicine tests: ordered.  d dimer, troponin, ecg, cxr, d dimer, mag, bmp for palpitations UA and renal study as she had blood and ca ox crystals on recent UA. Will recheck urine, ct stone study but wait for d dimer to ensure no need for cta at same time.  ***  {Document critical care time when appropriate  Document review of labs and clinical decision tools ie CHADS2VASC2, etc  Document your independent review of radiology images and any outside records  Document your discussion with family members, caretakers and with consultants  Document social determinants of health affecting pt's care  Document your decision making why or why not admission, treatments were needed:32947:::1}   Final diagnoses:  None    ED Discharge Orders     None

## 2024-04-12 LAB — URINE CULTURE: Culture: NO GROWTH

## 2024-04-22 DIAGNOSIS — N390 Urinary tract infection, site not specified: Secondary | ICD-10-CM | POA: Diagnosis not present

## 2024-05-04 DIAGNOSIS — Z6827 Body mass index (BMI) 27.0-27.9, adult: Secondary | ICD-10-CM | POA: Diagnosis not present

## 2024-05-04 DIAGNOSIS — N390 Urinary tract infection, site not specified: Secondary | ICD-10-CM | POA: Diagnosis not present

## 2024-08-01 ENCOUNTER — Ambulatory Visit: Attending: Cardiology | Admitting: Cardiology

## 2024-08-01 VITALS — BP 152/79 | HR 79 | Ht 62.0 in | Wt 154.6 lb

## 2024-08-01 DIAGNOSIS — I10 Essential (primary) hypertension: Secondary | ICD-10-CM | POA: Diagnosis not present

## 2024-08-01 DIAGNOSIS — R002 Palpitations: Secondary | ICD-10-CM | POA: Diagnosis not present

## 2024-08-01 NOTE — Progress Notes (Signed)
 Cardiology Office Note:  .   Date:  08/01/2024  ID:  Brenda Hunter, DOB August 22, 1935, MRN [id number] PCP: Brenda Carlin Redbird, MD  [facility] Providers Cardiologist:  Brenda Parchment, MD    History of Present Illness: Brenda   Brenda Hunter is a 89 y.o. female Discussed the use of AI scribe software   History of Present Illness Brenda Hunter is an 89 year old female with a history of PVCs and hypertension who presents for a follow-up visit.  She is here for a follow-up regarding her history of premature ventricular contractions (PVCs). No current palpitations are noted. A Holter monitor in 2017 indicated rare PVCs. She is on Toprol  XL 50 mg daily, increased from 25 mg, and amlodipine 2.5 mg daily. Occasionally, she feels her heart skipping even with the increased dose.  She has a history of hypertension, with today's reading slightly elevated. In March, her blood pressure spiked over 200 mmHg after a gel shot for knee pain, leading to a hospital visit and initiation of blood pressure medication. She takes Excedrin Migraine for headaches, which she experienced this morning.  In March, she developed knee problems and received a gel shot, resulting in nausea for three weeks and knee swelling. A cortisone shot from another doctor improved her symptoms. She was told by another doctor that her knee is worn out and may need a replacement, but she is hesitant about surgery.  She underwent successful robotic surgery for a hiatal hernia with mesh placement over a year ago. Post-surgery, she noticed changes in her speech, particularly when tired, describing it as 'halting' and having difficulty pronouncing words. She has been under anesthesia multiple times for various surgeries, including rotator cuff, knee meniscus, foot surgery, and hysterectomy, with the longest being for the hiatal hernia.  Recent lab results include an LDL of 109, hemoglobin A1c of 5.7, hemoglobin of 14.3, creatinine of 0.67, and  ALT of 15. She is doing well with her diet post-hiatal hernia surgery and no longer requires Prilosec, with no indigestion or acid reflux symptoms.      Studies Reviewed: .        Results LABS LDL: 109 mg/dL Hemoglobin J8r: 4.2% Hemoglobin: 14.3 g/dL Creatinine: 9.32 mg/dL ALT: 15 U/L Risk Assessment/Calculations:           Physical Exam:   VS:  BP (!) 152/79   Pulse 79   Ht 5' 2 (1.575 m)   Wt 154 lb 9.6 oz (70.1 kg)   SpO2 98%   BMI 28.28 kg/m    Wt Readings from Last 3 Encounters:  08/01/24 154 lb 9.6 oz (70.1 kg)  04/11/24 150 lb (68 kg)  01/24/24 142 lb (64.4 kg)    GEN: Well nourished, well developed in no acute distress NECK: No JVD; No carotid bruits CARDIAC: RRR, no murmurs, no rubs, no gallops RESPIRATORY:  Clear to auscultation without rales, wheezing or rhonchi  ABDOMEN: Soft, non-tender, non-distended EXTREMITIES:  No edema; No deformity   ASSESSMENT AND PLAN: .    Assessment and Plan Assessment & Plan Premature ventricular contractions (PVCs) Rare PVCs were identified on a Holter monitor in 2017. Currently, she reports no palpitations, and heart sounds are normal, indicating a well-managed cardiac status. Toprol  XL was increased to 50 mg daily, effectively managing symptoms. - Continue Toprol  XL 50 mg daily - Space out cardiology visits to every two years if stable, with the option to return sooner if needed  Hypertension Blood pressure is  slightly elevated today. She experienced significant blood pressure elevation following a gel shot for knee pain, which was managed with the initiation of antihypertensive medication. Currently, she is taking amlodipine 2.5 mg daily, and blood pressure management appears well-controlled with this regimen. - Continue amlodipine 2.5 mg daily - Monitor blood pressure regularly - Consider follow-up with primary care physician for ongoing management  Dysarthria - Having some trouble with word finding.  Has hesitancy  during her speech.  She noticed this worsen after her last major hiatal hernia surgery.  Could always see neurology.  She has discussed this with her primary care physician in the past.       Dispo: 1 yr  Signed, Brenda Parchment, MD

## 2024-08-01 NOTE — Patient Instructions (Signed)

## 2024-08-15 NOTE — Telephone Encounter (Signed)
 I spoke to Huong and let her know that her medication has been approved.

## 2024-08-23 DIAGNOSIS — M1711 Unilateral primary osteoarthritis, right knee: Secondary | ICD-10-CM | POA: Diagnosis not present

## 2024-09-17 DIAGNOSIS — H04221 Epiphora due to insufficient drainage, right lacrimal gland: Secondary | ICD-10-CM | POA: Diagnosis not present

## 2024-09-17 DIAGNOSIS — H353132 Nonexudative age-related macular degeneration, bilateral, intermediate dry stage: Secondary | ICD-10-CM | POA: Diagnosis not present

## 2024-09-17 DIAGNOSIS — H04123 Dry eye syndrome of bilateral lacrimal glands: Secondary | ICD-10-CM | POA: Diagnosis not present

## 2024-09-17 DIAGNOSIS — H40013 Open angle with borderline findings, low risk, bilateral: Secondary | ICD-10-CM | POA: Diagnosis not present

## 2024-09-21 DIAGNOSIS — Z23 Encounter for immunization: Secondary | ICD-10-CM | POA: Diagnosis not present

## 2024-09-27 DIAGNOSIS — M25561 Pain in right knee: Secondary | ICD-10-CM | POA: Diagnosis not present

## 2024-10-02 DIAGNOSIS — G8929 Other chronic pain: Secondary | ICD-10-CM | POA: Diagnosis not present

## 2024-10-02 DIAGNOSIS — M1711 Unilateral primary osteoarthritis, right knee: Secondary | ICD-10-CM | POA: Diagnosis not present

## 2024-10-02 DIAGNOSIS — M25561 Pain in right knee: Secondary | ICD-10-CM | POA: Diagnosis not present

## 2024-11-20 NOTE — Therapy (Incomplete)
 " OUTPATIENT PHYSICAL THERAPY LOWER EXTREMITY EVALUATION   Patient Name: Brenda Hunter MRN: [id number] DOB:06/09/1935, 89 y.o., female Today's Date: 11/20/2024   END OF SESSION:   Past Medical History:  Diagnosis Date   Arthritis    KNEES   Complication of anesthesia    MALES LOTS OF URINE AFTER ANESTHESIA   Dysrhythmia    GERD (gastroesophageal reflux disease)    Hematuria    Bladder Spasms (Dr. Janit)   Hiatal hernia    History of rectal polyps    HOH (hard of hearing)    LEFT EAR   Nausea & vomiting 05/14/2023   Peripheral edema    Past Surgical History:  Procedure Laterality Date   BASAL CELL REMOVED FROM LIP  09/2012   COLONSCOPY     CYSTECTOMY     left breats benign   ESOPHAGOGASTRODUODENOSCOPY (EGD) WITH PROPOFOL  N/A 05/04/2017   Procedure: ESOPHAGOGASTRODUODENOSCOPY (EGD) WITH PROPOFOL ;  Surgeon: Celestia Agent, MD;  Location: [facility];  Service: Endoscopy;  Laterality: N/A;   ESOPHAGOGASTRODUODENOSCOPY ENDOSCOPY     HAMMER TOE SURGERY     right foot 2016   KNEE ARTHROSCOPY     right knee   RECTAL POLYPECTOMY     rectal polyps   ROTATOR CUFF REPAIR     right shoulder   SAVORY DILATION N/A 05/04/2017   Procedure: SAVORY DILATION;  Surgeon: Celestia Agent, MD;  Location: [facility];  Service: Endoscopy;  Laterality: N/A;   TOTAL ABDOMINAL HYSTERECTOMY     2001   XI ROBOTIC ASSISTED HIATAL HERNIA REPAIR N/A 05/17/2023   Procedure: XI ROBOTIC ASSISTED HIATAL HERNIA REPAIR;  Surgeon: Sheldon Standing, MD;  Location: [facility];  Service: General;  Laterality: N/A;   Patient Active Problem List   Diagnosis Date Noted   IDA (iron deficiency anemia) 05/15/2023   Incarcerated hiatal hernia 05/14/2023   Nausea & vomiting 05/14/2023   Esophageal stricture 05/14/2023   Pelvic prolapse 05/14/2023   Gastric outflow obstruction 05/14/2023   History of cameron gastric ulcer 05/14/2023   Overweight 05/14/2023   HOH (hard of hearing)    History of rectal polyps     Hiatal hernia    GERD (gastroesophageal reflux disease)    Dysrhythmia    Arthritis    Peripheral edema    Perforation of left tympanic membrane 03/10/2023   Palpitations 01/11/2020   Essential hypertension 01/11/2020   LAFB (left anterior fascicular block) 01/11/2020   Dysfunction of both eustachian tubes 02/08/2017   Mixed conductive and sensorineural hearing loss of both ears 02/08/2017   Cystitis 12/27/2013   Urinary urgency 12/27/2013   Microscopic hematuria 11/04/2011   Recurrent UTI 11/04/2011    PCP: Okey Carlin Redbird, MD   REFERRING PROVIDER: Okey Carlin Redbird, MD   REFERRING DIAG: [phone] (ICD-10-CM) - Knee pain    THERAPY DIAG:  No diagnosis found.  RATIONALE FOR EVALUATION AND TREATMENT: Rehabilitation  ONSET DATE: ***Acute on chronic - exacerbation since 09/23/24  NEXT MD VISIT: ***PRN with Dr. Kay at [facility]   SUBJECTIVE:  SUBJECTIVE STATEMENT: ***Pt stumbled and hurt her R knee and LE at [facility] on 09/23/24 and it has bothered her since.  11/07/24 - Per Dr. Kay at [facility]:  Right knee MRI scan ([facility]-outside images) reviewed in detail with the patient showing evidence of high-grade cartilage loss in the patellofemoral compartment with bruising of the patella but no fracture. Quad tendon and patellar tendon intact. Patient does have moderate cartilage loss in the medial and lateral compartments and medial and lateral meniscus tears with likely medial compartment meniscal insufficiency from prior meniscectomy.   Right knee pain due to jamming type injury in the setting of arthritis. I explained to the patient that she basically has worn her knee out. She is 59 but fairly active. She does have a tough decision if her pain does not get better on its  own. It may settle down over time with conservative treatment. We discussed topicals, cortisone injections versus surgery. The surgery that would fix her knee issue would be a total knee arthroplasty. This certainly would carry perioperative risk but also the benefit potentially restoring range of motion and strength to her improving her mobility and quality of life. We agreed mutually to continue with conservative measures right now with modification of activities with follow-up plan for injections as needed and certainly happy to discuss total knee arthroplasty if she decides on that. She does have an upcoming appointment with her primary care physician as they are battling some high blood pressure issues. She admits to taking some ibuprofen which can elevate blood pressure. She asked about other medicine options which may not elevate her blood pressure and I encouraged her to talk to Dr. Okey about this, I am thinking maybe meloxicam or Celebrex. Follow-up with me as needed.   PAIN: Are you having pain? {OPRCPAIN:27236}  PERTINENT HISTORY:  ***Chronic R knee pain with severe OA, HTN, HOH on L, dysrhythmia, GERD, peripheral edema  PRECAUTIONS: {Therapy precautions:24002}  RED FLAGS: {PT Red Flags:29287}  WEIGHT BEARING RESTRICTIONS: {Yes ***/No:24003}  FALLS:  Has patient fallen in last 6 months? {fallsyesno:27318}  LIVING ENVIRONMENT: Lives with: {OPRC lives with:25569::lives with their family} Lives in: {Lives in:25570} Stairs: {opstairs:27293} Has following equipment at home: {Assistive devices:23999}  OCCUPATION: ***Retired  PLOF: {PLOF:24004}  PATIENT GOALS: ***   OBJECTIVE: (objective measures completed at initial evaluation unless otherwise dated)  DIAGNOSTIC FINDINGS:  ***10/27/2024 - MRI KNEE RIGHT WO CONTRAST-- Multiplanar, multisequence MR imaging of the knee was performed without contrast.   INDICATION: Knee pain, right.  Pain and weakness for one month. Surgery in  2014.   COMPARISON:  None.    FINDINGS:   Bones: Patchy edema in the inferior pole the patella. No discrete fracture line. Probable interosseous ganglia in the fibular head measuring 2.3 cm. There are severe degenerative changes of the proximal tibiofibular joint. Remote bone fragment along the fibular head.   Anterior cruciate ligament: Chronic complete tear of the ACL.   Posterior cruciate ligament: Intact.   Medial meniscus: Diminutive medial meniscus likely relates to postsurgical change.   Lateral meniscus: Complex tear of the anterior horn extending of the body. Extrusion of the body.   Medial collateral ligament: Intact.   Lateral collateral ligamentous structures (including fibular collateral ligament, biceps femoris tendon, popliteus tendon, and posterolateral corner structures): Intact.   Cartilage:  - Medial compartment: Areas of high-grade and full-thickness cartilage loss throughout the weightbearing medial femoral condyle. Partial-thickness cartilage thinning in the medial tibial plateau.  - Lateral compartment: Confluent full-thickness cartilage  loss in the lateral tibial plateau. Partial-thickness cartilage thinning in the weightbearing lateral femoral condyle.  - Patellofemoral compartment: Confluent full-thickness cartilage loss in the lateral patellar facet and lateral trochlea   Extensor mechanism, including quadriceps tendon, medial and lateral patellar retinaculum, and patellar tendon: Moderate insertional quadriceps tendinosis with low-grade partial tearing. Moderate patellar tendinosis.   IT band/pes anserine tendons: Intact.   Joint: Mild lateral patellar tilt. Small effusion. Synovitis. Suspect anterior loose bodies.   Soft tissues: Nonspecific prepatellar and superficial infrapatellar subcutaneous edema. Small Baker cyst.    IMPRESSION:  1. Chronic complete tear of the ACL.  2.  Tricompartmental degenerative changes, severe in patellofemoral compartment.   3.  Edema in the inferior pole of the patella may relate to osteochondral abnormality or stress reaction.  4.  Severe degenerative changes of the proximal tibiofibular joint with interosseous ganglion in the fibular head.  5.  Complex tear and extrusion of the lateral meniscus.  6.  Probable postsurgical changes to the medial meniscus.  7.  Moderate tendinosis of the extensor mechanism with low-grade partial tear of the insertional quadriceps.  8.  Mild lateral patellar tilt. Small effusion. Synovitis. Loose bodies.   PATIENT SURVEYS:  LEFS  Extreme difficulty/unable (0), Quite a bit of difficulty (1), Moderate difficulty (2), Little difficulty (3), No difficulty (4) Survey date:  11/20/2024 ***  Any of your usual work, housework or school activities   2. Usual hobbies, recreational or sporting activities   3. Getting into/out of the bath   4. Walking between rooms   5. Putting on socks/shoes   6. Squatting    7. Lifting an object, like a bag of groceries from the floor   8. Performing light activities around your home   9. Performing heavy activities around your home   10. Getting into/out of a car   11. Walking 2 blocks   12. Walking 1 mile   13. Going up/down 10 stairs (1 flight)   14. Standing for 1 hour   15.  sitting for 1 hour   16. Running on even ground   17. Running on uneven ground   18. Making sharp turns while running fast   19. Hopping    20. Rolling over in bed   Score total:  ***    COGNITION: Overall cognitive status: {cognition:24006}    SENSATION: {sensation:27233}  EDEMA:  {edema:24020}  POSTURE:  {posture:25561}  PALPATION: ***  MUSCLE LENGTH: Hamstrings: Right *** deg; Left *** deg Brenda Hunter test: Right *** deg; Left *** deg Hamstrings: *** ITB: *** Piriformis: *** Hip flexors: *** Quads: *** Heelcord: ***  LOWER EXTREMITY ROM:  {AROM/PROM:27142} ROM Right eval Left eval  Hip flexion    Hip extension    Hip abduction    Hip adduction     Hip internal rotation    Hip external rotation    Knee flexion    Knee extension    Ankle dorsiflexion    Ankle plantarflexion    Ankle inversion    Ankle eversion     {AROM/PROM:27142} ROM Right eval Left eval  Hip flexion    Hip extension    Hip abduction    Hip adduction    Hip internal rotation    Hip external rotation    Knee flexion    Knee extension    Ankle dorsiflexion    Ankle plantarflexion    Ankle inversion    Ankle eversion    (Blank rows = not tested)  LOWER  EXTREMITY MMT:  MMT Right eval Left eval  Hip flexion    Hip extension    Hip abduction    Hip adduction    Hip internal rotation    Hip external rotation    Knee flexion    Knee extension    Ankle dorsiflexion    Ankle plantarflexion    Ankle inversion    Ankle eversion     (Blank rows = not tested)  LOWER EXTREMITY SPECIAL TESTS:  {LEspecialtests:26242}  FUNCTIONAL TESTS:  {Functional tests:24029}  GAIT: Distance walked: *** Assistive device utilized: {Assistive devices:23999} Level of assistance: {Levels of assistance:24026} Gait pattern: {gait characteristics:25376} Comments: ***   TODAY'S TREATMENT:   *** 11/20/2024 - Eval SELF CARE:  Reviewed eval findings and role of PT in addressing identified deficits as well as instruction in initial HEP (see below).    PATIENT EDUCATION:  Education details: {Education details:27468}  Person educated: {Person educated:25204} Education method: {Education Method [employer] Education comprehension: {Education Comprehension:25206}  HOME EXERCISE PROGRAM: ***   ASSESSMENT:  CLINICAL IMPRESSION: Brenda Hunter is a 89 y.o. female who was referred to physical therapy for evaluation and treatment for *** acute on chronic R knee pain.  ***   Patient reports onset of *** pain beginning ***. Pain is worse with ***.  Patient has deficits in *** ROM, *** LE flexibility, *** strength, ***abnormal posture, and TTP with abnormal muscle tension  *** which are interfering with ADLs and are impacting quality of life.  On LEFS patient scored ***/80 demonstrating *** functional limitation.  Brenda Hunter will benefit from skilled PT to address above deficits to improve mobility and activity tolerance with decreased pain interference.  OBJECTIVE IMPAIRMENTS: {opptimpairments:25111}.   ACTIVITY LIMITATIONS: {activitylimitations:27494}  PARTICIPATION LIMITATIONS: {participationrestrictions:25113}  PERSONAL FACTORS: {Personal factors:25162} are also affecting patient's functional outcome.   REHAB POTENTIAL: {rehabpotential:25112}  CLINICAL DECISION MAKING: {clinical decision making:25114}  EVALUATION COMPLEXITY: {Evaluation complexity:25115}   GOALS: Goals reviewed with patient? {yes/no:20286}  SHORT TERM GOALS: Target date: ***  Patient will be independent with initial HEP. Baseline: *** Goal status: {GOALSTATUS:25110}  2.  Patient will report at least 25% improvement in *** knee pain to improve QOL. Baseline: *** Goal status: {GOALSTATUS:25110}  3.  *** Baseline: *** Goal status: {GOALSTATUS:25110}   LONG TERM GOALS: Target date: ***  Patient will be independent with advanced/ongoing HEP to improve outcomes and carryover.  Baseline: *** Goal status: {GOALSTATUS:25110}  2.  Patient will report at least 50-75% improvement in *** knee pain to improve QOL. Baseline: *** Goal status: {GOALSTATUS:25110}  3.  Patient will demonstrate improved *** knee AROM to >/= ***-*** deg to allow for normal gait and stair mechanics. Baseline: Refer to above LE ROM table Goal status: {GOALSTATUS:25110}  4.  Patient will demonstrate improved *** strength to >/= ***/5 for improved stability and ease of mobility. Baseline: Refer to above LE MMT table Goal status: {GOALSTATUS:25110}  5.  Patient will be able to ambulate 600' with LRAD and normal gait pattern without increased pain to access community.  Baseline: *** Goal status:  {GOALSTATUS:25110}  6. Patient will be able to ascend/descend stairs with 1 HR and reciprocal step pattern safely to access home and community.  Baseline: *** Goal status: {GOALSTATUS:25110}  7.  Patient will report >/= ***/80 on LEFS (MCID = 9 pts) to demonstrate improved functional ability. Baseline: *** Goal status: {GOALSTATUS:25110}  8.  Patient will demonstrate at least 19/24 on DGI to decrease risk of falls. Baseline: *** Goal status: {GOALSTATUS:25110}  9.  *** Baseline: *** Goal status: {GOALSTATUS:25110}   PLAN:  PT FREQUENCY: {rehab frequency:25116}  PT DURATION: {rehab duration:25117}  PLANNED INTERVENTIONS: {rehab planned interventions:25118::97110-Therapeutic exercises,97530- Therapeutic [phone]- Neuromuscular re-education,97535- Self Rjmz,02859- Manual therapy,Patient/Family education}  PLAN FOR NEXT SESSION: ***   Elijah CHRISTELLA Hidden, PT 11/20/2024, 12:10 PM  "

## 2024-11-26 ENCOUNTER — Ambulatory Visit: Admitting: Physical Therapy

## 2024-11-29 ENCOUNTER — Ambulatory Visit

## 2024-11-29 ENCOUNTER — Other Ambulatory Visit: Payer: Self-pay

## 2024-11-29 DIAGNOSIS — M25661 Stiffness of right knee, not elsewhere classified: Secondary | ICD-10-CM

## 2024-11-29 DIAGNOSIS — G8929 Other chronic pain: Secondary | ICD-10-CM

## 2024-11-29 DIAGNOSIS — R293 Abnormal posture: Secondary | ICD-10-CM

## 2024-11-29 NOTE — Therapy (Addendum)
 " OUTPATIENT PHYSICAL THERAPY LOWER EXTREMITY EVALUATION   Patient Name: Brenda Hunter MRN: [id number] DOB:May 30, 1935, 89 y.o., female Today's Date: 11/29/2024  END OF SESSION:  PT End of Session - 11/29/24 1134     Visit Number 1    Date for Recertification  12/27/24    PT Start Time 0930    PT Stop Time 1030    PT Time Calculation (min) 60 min          Past Medical History:  Diagnosis Date   Arthritis    KNEES   Complication of anesthesia    MALES LOTS OF URINE AFTER ANESTHESIA   Dysrhythmia    GERD (gastroesophageal reflux disease)    Hematuria    Bladder Spasms (Dr. Janit)   Hiatal hernia    History of rectal polyps    HOH (hard of hearing)    LEFT EAR   Nausea & vomiting 05/14/2023   Peripheral edema    Past Surgical History:  Procedure Laterality Date   BASAL CELL REMOVED FROM LIP  09/2012   COLONSCOPY     CYSTECTOMY     left breats benign   ESOPHAGOGASTRODUODENOSCOPY (EGD) WITH PROPOFOL  N/A 05/04/2017   Procedure: ESOPHAGOGASTRODUODENOSCOPY (EGD) WITH PROPOFOL ;  Surgeon: Celestia Agent, MD;  Location: [facility];  Service: Endoscopy;  Laterality: N/A;   ESOPHAGOGASTRODUODENOSCOPY ENDOSCOPY     HAMMER TOE SURGERY     right foot 2016   KNEE ARTHROSCOPY     right knee   RECTAL POLYPECTOMY     rectal polyps   ROTATOR CUFF REPAIR     right shoulder   SAVORY DILATION N/A 05/04/2017   Procedure: SAVORY DILATION;  Surgeon: Celestia Agent, MD;  Location: [facility];  Service: Endoscopy;  Laterality: N/A;   TOTAL ABDOMINAL HYSTERECTOMY     2001   XI ROBOTIC ASSISTED HIATAL HERNIA REPAIR N/A 05/17/2023   Procedure: XI ROBOTIC ASSISTED HIATAL HERNIA REPAIR;  Surgeon: Sheldon Standing, MD;  Location: [facility];  Service: General;  Laterality: N/A;   Patient Active Problem List   Diagnosis Date Noted   IDA (iron deficiency anemia) 05/15/2023   Incarcerated hiatal hernia 05/14/2023   Nausea & vomiting 05/14/2023   Esophageal stricture 05/14/2023   Pelvic prolapse  05/14/2023   Gastric outflow obstruction 05/14/2023   History of cameron gastric ulcer 05/14/2023   Overweight 05/14/2023   HOH (hard of hearing)    History of rectal polyps    Hiatal hernia    GERD (gastroesophageal reflux disease)    Dysrhythmia    Arthritis    Peripheral edema    Perforation of left tympanic membrane 03/10/2023   Palpitations 01/11/2020   Essential hypertension 01/11/2020   LAFB (left anterior fascicular block) 01/11/2020   Dysfunction of both eustachian tubes 02/08/2017   Mixed conductive and sensorineural hearing loss of both ears 02/08/2017   Cystitis 12/27/2013   Urinary urgency 12/27/2013   Microscopic hematuria 11/04/2011   Recurrent UTI 11/04/2011    PCP: Okey Carlin Redbird  REFERRING PROVIDER: Okey Carlin Redbird  REFERRING DIAG: R knee pain  THERAPY DIAG:  Abnormal posture  Chronic pain of right knee  Stiffness of right knee, not elsewhere classified  Rationale for Evaluation and Treatment: Rehabilitation  ONSET DATE: 09/23/25  SUBJECTIVE:   SUBJECTIVE STATEMENT:  Eval: Brenda Hunter is coming in for R knee pain from a fall she had in November. Pt was walking out of [employer], stumbled forward and felt a sharp pain in her R knee. She immediately  went home to put ice on it and it did not get better. She is 10 weeks out since the incident and has been doing the exercises and taking her rx prescribed by her doctor. She reports to have been getting better and doesn't know if she really need PT since she has improved. Pt had a R mensicus surgery in 2014 and wants to avoid any other surgery. Pt has morning stiffness in her R knee and reports she has been needing to work on her balance. She doesn't use stairs and is very active around the house and in a community setting.   PERTINENT HISTORY: Referred by PCP PAIN:  Are you having pain? Yes: NPRS scale: 0/10  Pain location: R medial knee Pain description: Achy, stiffness, deep Aggravating factors:  Bending past 90 Relieving factors: Ice, Rest  PRECAUTIONS: None  RED FLAGS: None   WEIGHT BEARING RESTRICTIONS: No  FALLS:  Has patient fallen in last 6 months? No  LIVING ENVIRONMENT: Lives with: lives with their family and lives with their spouse Lives in: House/apartment Stairs: Yes: External: 2 steps; none Has following equipment at home: None  OCCUPATION: Retired   PLOF: Independent  PATIENT GOALS: Get her knee better where she doesn't have pain  NEXT MD VISIT: TBD  OBJECTIVE:  Note: Objective measures were completed at Evaluation unless otherwise noted.  DIAGNOSTIC FINDINGS:   PATIENT SURVEYS:  KOOS Score: 71% (Symptoms + Stiffness subtotal: 64%;  Pain subtotal: 78%;  Function, daily living subtotal: 85%;  Function, sports and recreational activities subtotal: 80%;  Quality of life subtotal: 50%)  COGNITION: Overall cognitive status: Within functional limits for tasks assessed     SENSATION: WFL  EDEMA:  Circumferential: Noticeable swelling on R compared to L   MUSCLE LENGTH: Hamstrings: Right wnl deg; Left wnl deg  POSTURE: rounded shoulders, forward head, right pelvic obliquity, and weight shift left  PALPATION: R knee musculature tenderness on medial knee near joint capsule.   LOWER EXTREMITY ROM:  Active ROM Right eval Left eval  Hip flexion    Hip extension    Hip abduction    Hip adduction    Hip internal rotation    Hip external rotation    Knee flexion 110 120  Knee extension -7 wnl  Ankle dorsiflexion    Ankle plantarflexion    Ankle inversion    Ankle eversion     (Blank rows = not tested)  LOWER EXTREMITY MMT:  MMT Right eval Left eval  Hip flexion 4+ 4+  Hip extension    Hip abduction 4+ 4+  Hip adduction    Hip internal rotation    Hip external rotation    Knee flexion 4+ 5  Knee extension 4+ 4+  Ankle dorsiflexion 5P! 5  Ankle plantarflexion    Ankle inversion    Ankle eversion     (Blank rows = not  tested)  LOWER EXTREMITY SPECIAL TESTS:  Hip special tests: Belvie (FABER) test: negative Knee special tests: None  FUNCTIONAL TESTS:  Berg Balance Scale:  Item Test date: 11/29/24 Date:  Date:   Sitting to standing 4. able to stand without using hands and stabilize independently Insert SmartPhrase OPRCBERGREEVAL Insert SmartPhrase OPRCBERGREEVAL  2. Standing unsupported 4. able to stand safely for 2 minutes    3. Sitting with back unsupported, feet supported 4. able to sit safely and securely for 2 minutes    4. Standing to sitting 4. sits safely with minimal use of hands  5. Pivot transfer  4. able to transfer safely with minor use of hands    6. Standing unsupported with eyes closed 4. able to stand 10 seconds safely    7. Standing unsupported with feet together 4. able to place feet together independently and stand 1 minute safely    8. Reaching forward with outstretched arms while standing 4. can reach forward confidently 25 cm (10 inches)    9. Pick up object from the floor from standing 4. able to pick up slipper safely and easily    10. Turning to look behind over left and right shoulders while standing 4. looks behind from both sides and weight shifts well    11. Turn 360 degrees 4. able to turn 360 degrees safely in 4 seconds or less    12. Place alternate foot on step or stool while standing unsupported 4. ble to stand independently and safely and complete 8 steps in 20 seconds    13. Standing unsupported one foot in front 4. able to place foot tandem independently and hold 30 seconds    14. Standing on one leg 2. able to lift leg independently and hold >= 3 seconds      Total Score 54/56 Total Score:    Total Score:    **  GAIT: Distance walked: Around clinic Assistive device utilized: None Level of assistance: Complete Independence Comments: R knee flexion contracture                                                                                                                                 TREATMENT DATE:  11/29/24  Eval: R LE tibial femoral distraction 1 bout 30s Seated LAQ x10 BTB R LE  PATIENT EDUCATION:  Education details: HEP and POC Person educated: Patient Education method: Explanation, Demonstration, Tactile cues, Verbal cues, and Handouts Education comprehension: verbalized understanding, returned demonstration, verbal cues required, and tactile cues required  HOME EXERCISE PROGRAM: Access Code: [email or web address] URL: [email or web address] Date: 11/29/2024 Prepared by: Greig Credit  Exercises - Seated Long Arc Quad with Strap  - 1 x daily - 7 x weekly - 3 sets - 10 reps  ASSESSMENT:  CLINICAL IMPRESSION: Patient is a 89 y.o. female who was seen today for physical therapy evaluation and treatment for R knee pain. PT observed pt has a R knee ext lag of -7 degrees with genu valgum when going from sit-stand. Pt has a R knee flx contracture with gait and is very active. Pt would benefit from PT to address ROM and mobility deficits in R LE with therapeutic ex, manual therapy, strengthening, and pain management.   OBJECTIVE IMPAIRMENTS: decreased activity tolerance, decreased balance, decreased ROM, decreased strength, decreased safety awareness, and pain.   ACTIVITY LIMITATIONS: carrying, lifting, bending, sleeping, stairs, and bathing  PARTICIPATION LIMITATIONS: community activity  PERSONAL FACTORS: Age and Fitness are also affecting patient's functional outcome.   REHAB POTENTIAL: Excellent  CLINICAL DECISION MAKING:  Stable/uncomplicated  EVALUATION COMPLEXITY: Low   GOALS: Goals reviewed with patient? Yes  SHORT TERM GOALS: Target date: 12/13/24 Pt will be independent with HEP I  Baseline: Goal status: INITIAL  2.  Pt will have no R knee pain with full flexion  Baseline:  Goal status: INITIAL   LONG TERM GOALS: Target date: 12/27/24  Pt will have full R LE knee extension to 0  Baseline:  Goal status: INITIAL  2.   Pt reports she is able to go up stairs with no pain Baseline:  Goal status: INITIAL  3.  Pt will score >90% on KOOS  Baseline: See above   Goal status: INITIAL   PLAN:  PT FREQUENCY: 1x/week  PT DURATION: 4 weeks  PLANNED INTERVENTIONS: 97110-Therapeutic exercises, 97530- Therapeutic activity, [id number]- Neuromuscular re-education, 97535- Self Care, [id number]- Manual therapy, [id number]- Gait training, [phone]- Electrical stimulation (unattended), Patient/Family education, Cryotherapy, and Moist heat  PLAN FOR NEXT SESSION: Strengthening, manual therapy, and discuss HEP   Amy L Speaks, PT, DPT, OCS 11/29/2024, 2:42 PM  "

## 2024-12-06 ENCOUNTER — Ambulatory Visit

## 2024-12-13 ENCOUNTER — Ambulatory Visit
